# Patient Record
Sex: Female | Born: 1970 | Race: Black or African American | Hispanic: No | Marital: Married | Smoking: Former smoker
Health system: Southern US, Community
[De-identification: ages and names within clinical notes are randomized; demographics above are authoritative.]

## PROBLEM LIST (undated history)

## (undated) DIAGNOSIS — G43909 Migraine, unspecified, not intractable, without status migrainosus: Secondary | ICD-10-CM

## (undated) DIAGNOSIS — F1010 Alcohol abuse, uncomplicated: Secondary | ICD-10-CM

## (undated) DIAGNOSIS — J45909 Unspecified asthma, uncomplicated: Secondary | ICD-10-CM

## (undated) DIAGNOSIS — K219 Gastro-esophageal reflux disease without esophagitis: Secondary | ICD-10-CM

## (undated) DIAGNOSIS — J302 Other seasonal allergic rhinitis: Secondary | ICD-10-CM

## (undated) DIAGNOSIS — C801 Malignant (primary) neoplasm, unspecified: Secondary | ICD-10-CM

## (undated) DIAGNOSIS — E041 Nontoxic single thyroid nodule: Secondary | ICD-10-CM

## (undated) DIAGNOSIS — I499 Cardiac arrhythmia, unspecified: Secondary | ICD-10-CM

## (undated) DIAGNOSIS — F410 Panic disorder [episodic paroxysmal anxiety] without agoraphobia: Secondary | ICD-10-CM

## (undated) DIAGNOSIS — F39 Unspecified mood [affective] disorder: Secondary | ICD-10-CM

## (undated) DIAGNOSIS — G35 Multiple sclerosis: Secondary | ICD-10-CM

## (undated) DIAGNOSIS — R630 Anorexia: Secondary | ICD-10-CM

## (undated) DIAGNOSIS — C8400 Mycosis fungoides, unspecified site: Secondary | ICD-10-CM

## (undated) HISTORY — DX: Migraine, unspecified, not intractable, without status migrainosus: G43.909

## (undated) HISTORY — DX: Alcohol abuse, uncomplicated: F10.10

## (undated) HISTORY — DX: Gastro-esophageal reflux disease without esophagitis: K21.9

## (undated) HISTORY — DX: Panic disorder (episodic paroxysmal anxiety): F41.0

## (undated) HISTORY — DX: Unspecified mood (affective) disorder: F39

## (undated) HISTORY — DX: Nontoxic single thyroid nodule: E04.1

## (undated) HISTORY — PX: OTHER SURGICAL HISTORY: SHX169

## (undated) HISTORY — DX: Anorexia: R63.0

## (undated) HISTORY — PX: JOINT REPLACEMENT: SHX530

## (undated) HISTORY — DX: Other seasonal allergic rhinitis: J30.2

## (undated) HISTORY — DX: Unspecified asthma, uncomplicated: J45.909

---

## 2001-08-16 ENCOUNTER — Other Ambulatory Visit: Admission: RE | Admit: 2001-08-16 | Discharge: 2001-08-16 | Payer: Self-pay | Admitting: Internal Medicine

## 2002-03-22 ENCOUNTER — Encounter: Payer: Self-pay | Admitting: Internal Medicine

## 2002-03-22 ENCOUNTER — Encounter: Admission: RE | Admit: 2002-03-22 | Discharge: 2002-03-22 | Payer: Self-pay | Admitting: Internal Medicine

## 2002-08-02 ENCOUNTER — Other Ambulatory Visit: Admission: RE | Admit: 2002-08-02 | Discharge: 2002-08-02 | Payer: Self-pay | Admitting: Internal Medicine

## 2003-08-05 ENCOUNTER — Encounter: Admission: RE | Admit: 2003-08-05 | Discharge: 2003-08-05 | Payer: Self-pay | Admitting: Internal Medicine

## 2003-09-20 ENCOUNTER — Encounter: Admission: RE | Admit: 2003-09-20 | Discharge: 2003-09-20 | Payer: Self-pay | Admitting: Neurosurgery

## 2004-12-23 ENCOUNTER — Other Ambulatory Visit: Admission: RE | Admit: 2004-12-23 | Discharge: 2004-12-23 | Payer: Self-pay | Admitting: Internal Medicine

## 2006-08-13 ENCOUNTER — Other Ambulatory Visit: Admission: RE | Admit: 2006-08-13 | Discharge: 2006-08-13 | Payer: Self-pay | Admitting: Family Medicine

## 2007-11-28 ENCOUNTER — Other Ambulatory Visit: Admission: RE | Admit: 2007-11-28 | Discharge: 2007-11-28 | Payer: Self-pay | Admitting: Family Medicine

## 2009-01-11 ENCOUNTER — Other Ambulatory Visit: Admission: RE | Admit: 2009-01-11 | Discharge: 2009-01-11 | Payer: Self-pay | Admitting: Family Medicine

## 2009-03-11 ENCOUNTER — Encounter: Admission: RE | Admit: 2009-03-11 | Discharge: 2009-03-11 | Payer: Self-pay | Admitting: Family Medicine

## 2009-03-26 ENCOUNTER — Encounter (INDEPENDENT_AMBULATORY_CARE_PROVIDER_SITE_OTHER): Payer: Self-pay | Admitting: Interventional Radiology

## 2009-03-26 ENCOUNTER — Encounter: Admission: RE | Admit: 2009-03-26 | Discharge: 2009-03-26 | Payer: Self-pay | Admitting: Family Medicine

## 2009-03-26 ENCOUNTER — Other Ambulatory Visit: Admission: RE | Admit: 2009-03-26 | Discharge: 2009-03-26 | Payer: Self-pay | Admitting: Interventional Radiology

## 2010-03-31 ENCOUNTER — Other Ambulatory Visit: Admission: RE | Admit: 2010-03-31 | Discharge: 2010-03-31 | Payer: Self-pay | Admitting: Family Medicine

## 2010-09-07 ENCOUNTER — Encounter: Payer: Self-pay | Admitting: Family Medicine

## 2010-09-08 ENCOUNTER — Encounter: Payer: Self-pay | Admitting: Family Medicine

## 2010-11-11 ENCOUNTER — Other Ambulatory Visit: Payer: Self-pay | Admitting: Family Medicine

## 2010-11-11 DIAGNOSIS — E041 Nontoxic single thyroid nodule: Secondary | ICD-10-CM

## 2011-01-08 ENCOUNTER — Other Ambulatory Visit: Payer: Self-pay | Admitting: Dermatology

## 2011-10-16 ENCOUNTER — Inpatient Hospital Stay (HOSPITAL_COMMUNITY): Admit: 2011-10-16 | Payer: Self-pay

## 2011-10-16 ENCOUNTER — Other Ambulatory Visit (HOSPITAL_COMMUNITY)
Admission: RE | Admit: 2011-10-16 | Discharge: 2011-10-16 | Disposition: A | Discharge status: Home / Self Care | Payer: Managed Care, Other (non HMO) | Source: Ambulatory Visit | Attending: Family Medicine | Admitting: Family Medicine

## 2011-10-16 DIAGNOSIS — Z1159 Encounter for screening for other viral diseases: Secondary | ICD-10-CM | POA: Insufficient documentation

## 2011-10-16 DIAGNOSIS — Z124 Encounter for screening for malignant neoplasm of cervix: Secondary | ICD-10-CM | POA: Insufficient documentation

## 2011-10-19 ENCOUNTER — Other Ambulatory Visit: Payer: Self-pay | Admitting: Family Medicine

## 2011-10-19 DIAGNOSIS — E041 Nontoxic single thyroid nodule: Secondary | ICD-10-CM

## 2011-11-13 ENCOUNTER — Ambulatory Visit
Admission: RE | Admit: 2011-11-13 | Discharge: 2011-11-13 | Disposition: A | Discharge status: Home / Self Care | Payer: Managed Care, Other (non HMO) | Source: Ambulatory Visit | Attending: Family Medicine | Admitting: Family Medicine

## 2011-11-13 DIAGNOSIS — E041 Nontoxic single thyroid nodule: Secondary | ICD-10-CM

## 2011-12-08 ENCOUNTER — Other Ambulatory Visit: Payer: Self-pay

## 2016-01-22 ENCOUNTER — Encounter: Payer: Self-pay | Admitting: *Deleted

## 2016-01-23 ENCOUNTER — Encounter: Payer: Self-pay | Admitting: *Deleted

## 2016-01-23 ENCOUNTER — Ambulatory Visit (INDEPENDENT_AMBULATORY_CARE_PROVIDER_SITE_OTHER): Payer: BLUE CROSS/BLUE SHIELD | Admitting: Neurology

## 2016-01-23 ENCOUNTER — Encounter: Payer: Self-pay | Admitting: Neurology

## 2016-01-23 ENCOUNTER — Other Ambulatory Visit (INDEPENDENT_AMBULATORY_CARE_PROVIDER_SITE_OTHER): Payer: BLUE CROSS/BLUE SHIELD

## 2016-01-23 ENCOUNTER — Telehealth: Payer: Self-pay | Admitting: Neurology

## 2016-01-23 VITALS — BP 110/80 | HR 116 | Ht 66.0 in | Wt 190.2 lb

## 2016-01-23 DIAGNOSIS — R202 Paresthesia of skin: Secondary | ICD-10-CM

## 2016-01-23 DIAGNOSIS — R29898 Other symptoms and signs involving the musculoskeletal system: Secondary | ICD-10-CM

## 2016-01-23 LAB — VITAMIN B12: Vitamin B-12: 144 pg/mL — ABNORMAL LOW (ref 211–911)

## 2016-01-23 LAB — SEDIMENTATION RATE: Sed Rate: 9 mm/hr (ref 0–20)

## 2016-01-23 LAB — VITAMIN D 25 HYDROXY (VIT D DEFICIENCY, FRACTURES): VITD: 16.78 ng/mL — ABNORMAL LOW (ref 30.00–100.00)

## 2016-01-23 MED ORDER — GABAPENTIN 300 MG PO CAPS: ORAL_CAPSULE | ORAL | Status: DC

## 2016-01-23 NOTE — Telephone Encounter (Signed)
Gloria Reeves 08-Nov-1970. She was needing a letter from Dr. Posey Pronto saying that she can be out of work from Monday to Wednesday. Please call her at [phone]. Thank you

## 2016-01-23 NOTE — Telephone Encounter (Signed)
OK to give letter 

## 2016-01-23 NOTE — Progress Notes (Signed)
Note routed

## 2016-01-23 NOTE — Telephone Encounter (Signed)
Please advise 

## 2016-01-23 NOTE — Patient Instructions (Addendum)
MRI brain with and without contrast Start gabapentin 1 tablet at bedtime for 3 days, then increase to 1 tablet twice daily. Check blood work Start physical therapy

## 2016-01-23 NOTE — Progress Notes (Signed)
[facility] Clinic Note - Initial Visit   Date: 01/23/2016  Gloria Reeves MRN: [id number] DOB: 05/22/1971   Dear Dr. Webb:  Thank you for your kind referral of Gloria Reeves for consultation of left arm pain and paresthesias. Although her history is well known to you, please allow us to reiterate it for the purpose of our medical record. The patient was accompanied to the clinic by self.   History of Present Illness: Gloria Reeves is a 45 y.o. left-handed African American female with asthma and panic attacks presenting for evaluation of left arm tingling and weakness.    Early June 2017, she developed new onset severe biparietal throbbing headache which lasted all day.  She took ibuprofen which did not help, but the headache has resolved by the following morning.  However, the following day, she developed tingling over the left side of the neck, ear, and entire left arm, forearm, and hand.  Symptoms are constant and she has not identified triggers or alleviating factors.  She has throbbing numbness which is exacerbated by movement.  She endorses weakness of the entire left arm and is unable to write, dress herself, or take a shower.  She was evaluated in urgent care who gave her a course of prednisone and flexeril, but denies any improvement.  She was seen by her PCP 3-days ago for these symptoms who referred her for urgent evaluation.   In 2004, she had tingling of the hands and underwent MRI cervical spine and EMG which was essentially normal.    Out-side paper records, electronic medical record, and images have been reviewed where available and summarized as:  MRI cervical spine wo contrast 08/05/2003:  There is mild cervical kyphosis. There is a small central disk herniation at C4-5.    Past Medical History  Diagnosis Date  . Migraine   . Esophageal reflux   . Seasonal allergies   . Asthma   . Anorexia   . Mood disorder (HCC)   .  Panic disorder   . Alcohol abuse   . Thyroid nodule     No past surgical history on file.   Medications:  Outpatient Encounter Prescriptions as of 01/23/2016  Medication Sig  . acetaminophen (TYLENOL) 325 MG tablet Take 650 mg by mouth every 6 (six) hours as needed.  . cyclobenzaprine (FLEXERIL) 10 MG tablet Take 10 mg by mouth at bedtime.  . ibuprofen (ADVIL,MOTRIN) 200 MG tablet Take 200 mg by mouth every 6 (six) hours as needed.  . medroxyPROGESTERone (DEPO-PROVERA) 150 MG/ML injection Inject 150 mg into the muscle every 3 (three) months.  . Multiple Vitamin (MULTIVITAMIN) tablet Take 1 tablet by mouth daily.  . naproxen sodium (ANAPROX) 220 MG tablet Take 220 mg by mouth 2 (two) times daily with a meal.  . [DISCONTINUED] buPROPion (WELLBUTRIN SR) 150 MG 12 hr tablet Take 150 mg by mouth daily.  . [DISCONTINUED] meloxicam (MOBIC) 7.5 MG tablet Take 7.5 mg by mouth daily.   No facility-administered encounter medications on file as of 01/23/2016.     Allergies:  Allergies  Allergen Reactions  . Keppra [Levetiracetam]   . Sulfa Antibiotics     Family History: No family history on file.  Social History: Social History  Substance Use Topics  . Smoking status: Former Smoker    Types: Cigarettes    Quit date: 09/05/2010  . Smokeless tobacco: Never Used  . Alcohol Use: 0.0 oz/week    0 Standard drinks or equivalent per   week   Social History   Social History Narrative   Lives alone in a one story home.  No children.  Works as a new business associate.  Education: college.    Review of Systems:  CONSTITUTIONAL: No fevers, chills, night sweats, or weight loss.   EYES: No visual changes or eye pain ENT: No hearing changes.  No history of nose bleeds.   RESPIRATORY: No cough, wheezing and shortness of breath.   CARDIOVASCULAR: Negative for chest pain, and palpitations.   GI: Negative for abdominal discomfort, blood in stools or black stools.  No recent change in bowel  habits.   GU:  No history of incontinence.   MUSCLOSKELETAL: No history of joint pain or swelling.  No myalgias.   SKIN: Negative for lesions, rash, and itching.   HEMATOLOGY/ONCOLOGY: Negative for prolonged bleeding, bruising easily, and swollen nodes.  No history of cancer.   ENDOCRINE: Negative for cold or heat intolerance, polydipsia or goiter.   PSYCH:  No depression +anxiety symptoms.   NEURO: As Above.   Vital Signs:  BP 110/80 mmHg  Pulse 116  Ht 5' 6" (1.676 m)  Wt 190 lb 3 oz (86.268 kg)  BMI 30.71 kg/m2  SpO2 98%   General Medical Exam:   General:  Well appearing, protecting the left arm even while sitting, comfortable.   Eyes/ENT: see cranial nerve examination.   Neck: No masses appreciated.  Full range of motion without tenderness.  No carotid bruits. Respiratory:  Clear to auscultation, good air entry bilaterally.   Cardiac:  Regular rate and rhythm, no murmur.   Extremities:  No deformities, edema, or skin discoloration.  Skin:  No rashes or lesions.  Neurological Exam: MENTAL STATUS including orientation to time, place, person, recent and remote memory, attention span and concentration, language, and fund of knowledge is normal.  Speech is not dysarthric.  CRANIAL NERVES: II:  No visual field defects.  Unremarkable fundi.   III-IV-VI: Pupils equal round and reactive to light.  Normal conjugate, extra-ocular eye movements in all directions of gaze.  No nystagmus.  No ptosis.   V:  Normal facial sensation.     VII:  Normal facial symmetry and movements.   VIII:  Normal hearing and vestibular function.   IX-X:  Normal palatal movement.   XI:  Normal shoulder shrug and head rotation.   XII:  Normal tongue strength and range of motion, no deviation or fasciculation.  MOTOR:  Motor strength testing of all the extremities is 5/5 except, there is marked give-way weakness on the left upper extremities and bilateral intrinsic hand muscles.  No atrophy, fasciculations or  abnormal movements.  No pronator drift.  Tone is normal.    MSRs:  Right                                                                 Left brachioradialis 2+  brachioradialis 2+  biceps 2+  biceps 2+  triceps 2+  triceps 2+  patellar 2+  patellar 2+  ankle jerk 2+  ankle jerk 2+  Hoffman no  Hoffman no  plantar response down  plantar response down   SENSORY:  Normal and symmetric perception of light touch, pinprick, vibration, and proprioception.   COORDINATION/GAIT: Normal finger-to- nose-finger   and heel-to-shin.  Intact rapid alternating movements bilaterally.  Able to rise from a chair without using arms.  Gait narrow based and stable. Tandem and stressed gait intact.    IMPRESSION: Ms. Hardin Negus is a 45 year-old female referred for evaluation of left arm paresthesias and weakness which started abruptly in early June, following onset of a headache.  Her exam is difficult to accurately assess given marked give-way weakness.  She does not give full effort on motor testing, but proximal muscles on the left with repeated attempts do appears to be 5/5.  It would be highly unusual to have entire arm paresthesias and weakness with normal reflexes, if this was stemming from the cervical spinal cord.  Therefore, I will obtain imaging of the brain to look for structural changes such as demyelinating disease.  I agree with her PCP that this is not consistent with shingles.    PLAN/RECOMMENDATIONS:  1.  MRI brain wwo contrast 2.  Check ESR, vitamin B12, vitamin D 3.  Start gabapentin 1 tablet at bedtime for 3 days, then increase to 1 tablet twice daily. 4.  Start physical therapy  Return to clinic in 2-3 months.   The duration of this appointment visit was 40 minutes of face-to-face time with the patient.  Greater than 50% of this time was spent in counseling, explanation of diagnosis, planning of further management, and coordination of care.   Thank you for allowing me to participate in  patient's care.  If I can answer any additional questions, I would be pleased to do so.    Sincerely,    Donika K. Posey Pronto, DO

## 2016-01-24 ENCOUNTER — Other Ambulatory Visit: Payer: Self-pay | Admitting: *Deleted

## 2016-01-24 DIAGNOSIS — E559 Vitamin D deficiency, unspecified: Secondary | ICD-10-CM

## 2016-01-24 MED ORDER — VITAMIN D (ERGOCALCIFEROL) 1.25 MG (50000 UNIT) PO CAPS: 50000.0000 [IU] | ORAL_CAPSULE | ORAL | Status: AC

## 2016-01-24 NOTE — Telephone Encounter (Signed)
Patient said that this has already been taken care of.

## 2016-01-29 ENCOUNTER — Telehealth: Payer: Self-pay | Admitting: Neurology

## 2016-01-29 ENCOUNTER — Ambulatory Visit
Admission: RE | Admit: 2016-01-29 | Discharge: 2016-01-29 | Disposition: A | Discharge status: Home / Self Care | Payer: BLUE CROSS/BLUE SHIELD | Source: Ambulatory Visit | Attending: Neurology | Admitting: Neurology

## 2016-01-29 DIAGNOSIS — R29898 Other symptoms and signs involving the musculoskeletal system: Secondary | ICD-10-CM

## 2016-01-29 DIAGNOSIS — R202 Paresthesia of skin: Secondary | ICD-10-CM

## 2016-01-29 MED ORDER — GADOBENATE DIMEGLUMINE 529 MG/ML IV SOLN
18.0000 mL | Freq: Once | INTRAVENOUS | Status: AC | PRN
  Administered 2016-01-29: 18 mL via INTRAVENOUS

## 2016-01-29 NOTE — Telephone Encounter (Signed)
[facility] called with STAT report on patient's MR Brain available in EPIC.

## 2016-01-29 NOTE — Telephone Encounter (Signed)
Staff got call on pts MRI.  Dr Posey Pronto out of office but will be back tomorrow.  Lets get pt set up for MRI cervical spine with and without so that we can view this lesion better and see if there are more.  Dx:  Demyelinating disease.  Caryl Pina, just tell patient that we need to look down further in the spinal cord as it was incompletely imaged since only brain done thus far.

## 2016-01-30 ENCOUNTER — Other Ambulatory Visit: Payer: Self-pay | Admitting: *Deleted

## 2016-01-30 DIAGNOSIS — G379 Demyelinating disease of central nervous system, unspecified: Secondary | ICD-10-CM

## 2016-01-30 NOTE — Telephone Encounter (Signed)
Patient notified and order placed for MRI cervical spine.

## 2016-02-07 ENCOUNTER — Telehealth: Payer: Self-pay | Admitting: Neurology

## 2016-02-07 ENCOUNTER — Ambulatory Visit
Admission: RE | Admit: 2016-02-07 | Discharge: 2016-02-07 | Disposition: A | Discharge status: Home / Self Care | Payer: BLUE CROSS/BLUE SHIELD | Source: Ambulatory Visit | Attending: Neurology | Admitting: Neurology

## 2016-02-07 DIAGNOSIS — G379 Demyelinating disease of central nervous system, unspecified: Secondary | ICD-10-CM

## 2016-02-07 NOTE — Telephone Encounter (Signed)
Called and discussed results of MRI cervical spine which is concerning for a high cervical demyelinating lesion.  CSF testing is the next step to look for MS as well as exclude other possibilities like transverse myelitis.   CSF testing to include:  cell count and diff, protein, glucose, routine cultures, IgG index, oligoclonal bands, myelin basic protein, flow cytology, ACE, CSF lyme PCR, CSF VZV PCR, VDRL   Also instructed her that she needs to start vitamin B12 injections, which is she is agreeable to.  Aria Pickrell K. Posey Pronto, DO

## 2016-02-10 ENCOUNTER — Other Ambulatory Visit: Payer: Self-pay | Admitting: *Deleted

## 2016-02-10 DIAGNOSIS — R202 Paresthesia of skin: Secondary | ICD-10-CM

## 2016-02-10 DIAGNOSIS — G379 Demyelinating disease of central nervous system, unspecified: Secondary | ICD-10-CM

## 2016-02-10 DIAGNOSIS — R29898 Other symptoms and signs involving the musculoskeletal system: Secondary | ICD-10-CM

## 2016-02-10 NOTE — Telephone Encounter (Signed)
Order placed and labs faxed to [facility] at [employer].

## 2016-02-12 ENCOUNTER — Ambulatory Visit (INDEPENDENT_AMBULATORY_CARE_PROVIDER_SITE_OTHER): Payer: BLUE CROSS/BLUE SHIELD | Admitting: *Deleted

## 2016-02-12 DIAGNOSIS — E538 Deficiency of other specified B group vitamins: Secondary | ICD-10-CM | POA: Diagnosis not present

## 2016-02-12 MED ORDER — CYANOCOBALAMIN 1000 MCG/ML IJ SOLN
1000.0000 ug | Freq: Once | INTRAMUSCULAR | Status: AC
  Administered 2016-02-12: 1000 ug via INTRAMUSCULAR

## 2016-02-12 NOTE — Progress Notes (Signed)
Patient in for B12 injection. 

## 2016-02-13 ENCOUNTER — Ambulatory Visit (INDEPENDENT_AMBULATORY_CARE_PROVIDER_SITE_OTHER): Payer: BLUE CROSS/BLUE SHIELD | Admitting: *Deleted

## 2016-02-13 DIAGNOSIS — E538 Deficiency of other specified B group vitamins: Secondary | ICD-10-CM | POA: Diagnosis not present

## 2016-02-13 MED ORDER — CYANOCOBALAMIN 1000 MCG/ML IJ SOLN
1000.0000 ug | Freq: Once | INTRAMUSCULAR | Status: AC
  Administered 2016-02-13: 1000 ug via INTRAMUSCULAR

## 2016-02-13 NOTE — Progress Notes (Signed)
Patient in for B12 injection. 

## 2016-02-14 ENCOUNTER — Ambulatory Visit (INDEPENDENT_AMBULATORY_CARE_PROVIDER_SITE_OTHER): Payer: BLUE CROSS/BLUE SHIELD | Admitting: *Deleted

## 2016-02-14 DIAGNOSIS — E538 Deficiency of other specified B group vitamins: Secondary | ICD-10-CM

## 2016-02-14 MED ORDER — CYANOCOBALAMIN 1000 MCG/ML IJ SOLN
1000.0000 ug | Freq: Once | INTRAMUSCULAR | Status: AC
  Administered 2016-02-14: 1000 ug via INTRAMUSCULAR

## 2016-02-14 NOTE — Progress Notes (Signed)
Patient in for B12 injection. 

## 2016-02-17 ENCOUNTER — Ambulatory Visit (INDEPENDENT_AMBULATORY_CARE_PROVIDER_SITE_OTHER): Payer: BLUE CROSS/BLUE SHIELD | Admitting: *Deleted

## 2016-02-17 DIAGNOSIS — E538 Deficiency of other specified B group vitamins: Secondary | ICD-10-CM | POA: Diagnosis not present

## 2016-02-17 MED ORDER — CYANOCOBALAMIN 1000 MCG/ML IJ SOLN
1000.0000 ug | Freq: Once | INTRAMUSCULAR | Status: AC
  Administered 2016-02-17: 1000 ug via INTRAMUSCULAR

## 2016-02-17 NOTE — Progress Notes (Signed)
Patient in for B12 injection. 

## 2016-02-19 ENCOUNTER — Ambulatory Visit (INDEPENDENT_AMBULATORY_CARE_PROVIDER_SITE_OTHER): Payer: BLUE CROSS/BLUE SHIELD | Admitting: Neurology

## 2016-02-19 DIAGNOSIS — E538 Deficiency of other specified B group vitamins: Secondary | ICD-10-CM

## 2016-02-19 MED ORDER — CYANOCOBALAMIN 1000 MCG/ML IJ SOLN
1000.0000 ug | Freq: Once | INTRAMUSCULAR | Status: AC
  Administered 2016-02-19: 1000 ug via INTRAMUSCULAR

## 2016-02-19 NOTE — Progress Notes (Signed)
B12 injection to right deltoid with no apparent complications.   

## 2016-02-20 ENCOUNTER — Ambulatory Visit (INDEPENDENT_AMBULATORY_CARE_PROVIDER_SITE_OTHER): Payer: BLUE CROSS/BLUE SHIELD | Admitting: *Deleted

## 2016-02-20 DIAGNOSIS — E538 Deficiency of other specified B group vitamins: Secondary | ICD-10-CM

## 2016-02-20 MED ORDER — CYANOCOBALAMIN 1000 MCG/ML IJ SOLN
1000.0000 ug | Freq: Once | INTRAMUSCULAR | Status: AC
  Administered 2016-02-20: 1000 ug via INTRAMUSCULAR

## 2016-02-20 NOTE — Progress Notes (Signed)
Patient in for B12 injection. 

## 2016-02-21 ENCOUNTER — Other Ambulatory Visit: Payer: Self-pay | Admitting: Neurology

## 2016-02-21 ENCOUNTER — Other Ambulatory Visit (HOSPITAL_COMMUNITY)
Admission: RE | Admit: 2016-02-21 | Discharge: 2016-02-21 | Disposition: A | Discharge status: Home / Self Care | Payer: BLUE CROSS/BLUE SHIELD | Source: Ambulatory Visit | Attending: Neurology | Admitting: Neurology

## 2016-02-21 ENCOUNTER — Ambulatory Visit
Admission: RE | Admit: 2016-02-21 | Discharge: 2016-02-21 | Disposition: A | Discharge status: Home / Self Care | Payer: BLUE CROSS/BLUE SHIELD | Source: Ambulatory Visit | Attending: Neurology | Admitting: Neurology

## 2016-02-21 DIAGNOSIS — G379 Demyelinating disease of central nervous system, unspecified: Secondary | ICD-10-CM | POA: Diagnosis not present

## 2016-02-21 DIAGNOSIS — R202 Paresthesia of skin: Secondary | ICD-10-CM | POA: Diagnosis not present

## 2016-02-21 DIAGNOSIS — R29898 Other symptoms and signs involving the musculoskeletal system: Secondary | ICD-10-CM | POA: Diagnosis not present

## 2016-02-21 LAB — PROTEIN, CSF: Total Protein, CSF: 34 mg/dL (ref 15–45)

## 2016-02-21 LAB — CSF CELL COUNT WITH DIFFERENTIAL
Basophils, %: 0 %
Eosinophils, CSF: 0 %
LYMPHS CSF: 94 % — AB (ref 40–80)
MONOCYTE/MACROPHAGE: 6 % — AB (ref 15–45)
RBC COUNT CSF: 0 {cells}/uL (ref 0–10)
Segmented Neutrophils-CSF: 0 % (ref 0–6)
WBC CSF: 4 {cells}/uL (ref 0–5)

## 2016-02-21 LAB — GLUCOSE, CSF: Glucose, CSF: 61 mg/dL (ref 43–76)

## 2016-02-21 NOTE — Discharge Instructions (Signed)

## 2016-02-21 NOTE — Progress Notes (Signed)
One SST tube of blood drawn for LP labs from left [facility] space; site unremarkable; by Cristal Ford, RN.

## 2016-02-24 ENCOUNTER — Other Ambulatory Visit: Payer: BLUE CROSS/BLUE SHIELD

## 2016-02-24 ENCOUNTER — Ambulatory Visit (INDEPENDENT_AMBULATORY_CARE_PROVIDER_SITE_OTHER): Payer: BLUE CROSS/BLUE SHIELD | Admitting: *Deleted

## 2016-02-24 ENCOUNTER — Other Ambulatory Visit: Payer: Self-pay | Admitting: *Deleted

## 2016-02-24 DIAGNOSIS — E538 Deficiency of other specified B group vitamins: Secondary | ICD-10-CM | POA: Diagnosis not present

## 2016-02-24 LAB — CSF CULTURE
GRAM STAIN: NONE SEEN
ORGANISM ID, BACTERIA: NO GROWTH

## 2016-02-24 LAB — CSF CULTURE W GRAM STAIN

## 2016-02-24 MED ORDER — BUTALBITAL-APAP-CAFFEINE 50-325-40 MG PO TABS: 1.0000 | ORAL_TABLET | Freq: Two times a day (BID) | ORAL | Status: DC | PRN

## 2016-02-24 MED ORDER — CYANOCOBALAMIN 1000 MCG/ML IJ SOLN
1000.0000 ug | Freq: Once | INTRAMUSCULAR | Status: AC
  Administered 2016-02-24: 1000 ug via INTRAMUSCULAR

## 2016-02-24 NOTE — Progress Notes (Signed)
Patient in for B12 injection. 

## 2016-02-25 ENCOUNTER — Other Ambulatory Visit: Payer: Self-pay | Admitting: Neurology

## 2016-02-26 LAB — OLIGOCLONAL BANDS, CSF + SERM

## 2016-02-27 ENCOUNTER — Telehealth: Payer: Self-pay | Admitting: Neurology

## 2016-02-27 ENCOUNTER — Other Ambulatory Visit: Payer: Self-pay | Admitting: *Deleted

## 2016-02-27 ENCOUNTER — Ambulatory Visit: Payer: BLUE CROSS/BLUE SHIELD | Admitting: Neurology

## 2016-02-27 DIAGNOSIS — R202 Paresthesia of skin: Secondary | ICD-10-CM

## 2016-02-27 DIAGNOSIS — R29898 Other symptoms and signs involving the musculoskeletal system: Secondary | ICD-10-CM

## 2016-02-27 LAB — BORRELIA SPECIES DNA, FLUID, PCR: B. burgdorferi DNA: NOT DETECTED

## 2016-02-27 LAB — VARICELLA-ZOSTER BY PCR: VZV DNA, QL PCR: NOT DETECTED

## 2016-02-27 MED ORDER — METHYLPREDNISOLONE SODIUM SUCC 1000 MG IJ SOLR: 1000.0000 mg | Freq: Every day | INTRAMUSCULAR | Status: AC

## 2016-02-27 NOTE — Telephone Encounter (Signed)
Called patient to discuss her CSF results which favors an inflammatory process (positive oligoclonal bands, IgG index pending), likely multiple sclerosis.  I will treat her with Solumedrol 1g x 3 days.  Risks and benefits discussed.    Headache following LP has significantly improved with Fioricet.    Gloria K. Posey Pronto, DO

## 2016-02-27 NOTE — Telephone Encounter (Signed)
Left message for someone to call me back from short stay.

## 2016-02-28 NOTE — Telephone Encounter (Signed)
Patient notified that the infusions will start on July 24 at 12:00.

## 2016-02-29 LAB — VDRL, CSF: SYPHILIS VDRL QUANT CSF: NONREACTIVE

## 2016-02-29 LAB — ANGIOTENSIN CONVERTING ENZYME, CSF: ACE, CSF: 8 U/L (ref <=15)

## 2016-03-02 ENCOUNTER — Other Ambulatory Visit: Payer: Self-pay | Admitting: Family Medicine

## 2016-03-02 ENCOUNTER — Ambulatory Visit (INDEPENDENT_AMBULATORY_CARE_PROVIDER_SITE_OTHER): Payer: BLUE CROSS/BLUE SHIELD | Admitting: *Deleted

## 2016-03-02 DIAGNOSIS — G44009 Cluster headache syndrome, unspecified, not intractable: Secondary | ICD-10-CM

## 2016-03-02 DIAGNOSIS — E538 Deficiency of other specified B group vitamins: Secondary | ICD-10-CM

## 2016-03-02 LAB — CNS IGG SYNTHESIS RATE, CSF+BLOOD
ALBUMIN CSF: 17.4 mg/dL (ref 8.0–42.0)
Albumin, Serum(Neph): 4.4 g/dL (ref 3.5–4.9)
IGG, CSF: 5.9 mg/dL (ref 0.8–7.7)
IGG, SERUM: 1290 mg/dL (ref 694–1618)
IgG Index, CSF: 1.16 — ABNORMAL HIGH (ref <0.66)
MS CNS IGG SYNTHESIS RATE: 13.1 mg/(24.h) — AB (ref <=3.3)

## 2016-03-02 LAB — MYELIN BASIC PROTEIN, CSF: Myelin Basic Protein: <2 mcg/L (ref 0.0–4.0)

## 2016-03-02 MED ORDER — CYANOCOBALAMIN 1000 MCG/ML IJ SOLN
1000.0000 ug | Freq: Once | INTRAMUSCULAR | Status: AC
  Administered 2016-03-02: 1000 ug via INTRAMUSCULAR

## 2016-03-02 NOTE — Progress Notes (Signed)
Patient in for B12 injection. 

## 2016-03-04 ENCOUNTER — Other Ambulatory Visit: Payer: BLUE CROSS/BLUE SHIELD

## 2016-03-06 ENCOUNTER — Other Ambulatory Visit: Payer: Self-pay | Admitting: Family Medicine

## 2016-03-06 ENCOUNTER — Other Ambulatory Visit (HOSPITAL_COMMUNITY): Payer: Self-pay | Admitting: *Deleted

## 2016-03-06 DIAGNOSIS — E041 Nontoxic single thyroid nodule: Secondary | ICD-10-CM

## 2016-03-09 ENCOUNTER — Encounter (HOSPITAL_COMMUNITY)
Admission: RE | Admit: 2016-03-09 | Discharge: 2016-03-09 | Disposition: A | Discharge status: Home / Self Care | Payer: BLUE CROSS/BLUE SHIELD | Source: Ambulatory Visit | Attending: Neurology | Admitting: Neurology

## 2016-03-09 ENCOUNTER — Ambulatory Visit: Payer: BLUE CROSS/BLUE SHIELD

## 2016-03-09 DIAGNOSIS — G35 Multiple sclerosis: Secondary | ICD-10-CM | POA: Diagnosis not present

## 2016-03-09 HISTORY — DX: Multiple sclerosis: G35

## 2016-03-09 MED ORDER — METHYLPREDNISOLONE SODIUM SUCC 1000 MG IJ SOLR
1000.0000 mg | Freq: Every day | INTRAMUSCULAR | Status: DC
  Administered 2016-03-09: 1000 mg via INTRAVENOUS
  Filled 2016-03-09: qty 8

## 2016-03-09 NOTE — Discharge Instructions (Signed)
Methylprednisolone Solution for Injection  What is this medicine?  METHYLPREDNISOLONE (meth ill pred NISS oh lone) is a corticosteroid. It is commonly used to treat inflammation of the skin, joints, lungs, and other organs. Common conditions treated include asthma, allergies, and arthritis. It is also used for other conditions, such as blood disorders and diseases of the adrenal glands.  This medicine may be used for other purposes; ask your health care provider or pharmacist if you have questions.  What should I tell my health care provider before I take this medicine?  They need to know if you have any of these conditions:  -cataracts or glaucoma  -Cushing's syndrome  -heart disease  -high blood pressure  -infection including tuberculosis  -low calcium or potassium levels in the blood  -recent surgery  -seizures  -stomach or intestinal disease, including colitis  -threadworms  -thyroid problems  -an unusual or allergic reaction to methylprednisolone, corticosteroids, benzyl alcohol, other medicines, foods, dyes, or preservatives  -pregnant or trying to get pregnant  -breast-feeding  How should I use this medicine?  This medicine is for injection or infusion into a vein. It is also for injection into a muscle. It is given by a health care professional in a hospital or clinic setting.  Talk to your pediatrician regarding the use of this medicine in children. While this drug may be prescribed for selected conditions, precautions do apply.  Overdosage: If you think you have taken too much of this medicine contact a poison control center or emergency room at once.  NOTE: This medicine is only for you. Do not share this medicine with others.  What if I miss a dose?  This does not apply.  What may interact with this medicine?  Do not take this medicine with any of the following medications:  -mifepristone  This medicine may also interact with the following medications:  -aspirin and aspirin-like  medicines  -cyclosporin  -ketoconazole  -phenobarbital  -phenytoin  -rifampin  -tacrolimus  -troleandomycin  -vaccines  -warfarin  This list may not describe all possible interactions. Give your health care provider a list of all the medicines, herbs, non-prescription drugs, or dietary supplements you use. Also tell them if you smoke, drink alcohol, or use illegal drugs. Some items may interact with your medicine.  What should I watch for while using this medicine?  Visit your doctor or health care professional for regular checks on your progress. If you are taking this medicine for a long time, carry an identification card with your name and address, the type and dose of your medicine, and your doctor's name and address.  The medicine may increase your risk of getting an infection. Stay away from people who are sick. Tell your doctor or health care professional if you are around anyone with measles or chickenpox.  You may need to avoid some vaccines. Talk to your health care provider for more information.  If you are going to have surgery, tell your doctor or health care professional that you have taken this medicine within the last twelve months.  Ask your doctor or health care professional about your diet. You may need to lower the amount of salt you eat.  The medicine can increase your blood sugar. If you are a diabetic check with your doctor if you need help adjusting the dose of your diabetic medicine.  What side effects may I notice from receiving this medicine?  Side effects that you should report to your doctor or health care   professional as soon as possible:  -allergic reactions like skin rash, itching or hives, swelling of the face, lips, or tongue  -bloody or tarry stools  -changes in vision  -eye pain or bulging eyes  -fever, sore throat, sneezing, cough, or other signs of infection, wounds that will not heal  -increased thirst  -irregular heartbeat  -muscle cramps  -pain in hips, back, ribs, arms,  shoulders, or legs  -swelling of the ankles, feet, hands  -trouble passing urine or change in the amount of urine  -unusual bleeding or bruising  -unusually weak or tired  -weight gain or weight loss  Side effects that usually do not require medical attention (report to your doctor or health care professional if they continue or are bothersome):  -changes in emotions or moods  -constipation or diarrhea  -headache  -irritation at site where injected  -nausea, vomiting  -skin problems, acne, thin and shiny skin  -trouble sleeping  -unusual hair growth on the face or body  This list may not describe all possible side effects. Call your doctor for medical advice about side effects. You may report side effects to FDA at 1-800-FDA-1088.  Where should I keep my medicine?  This drug is given in a hospital or clinic and will not be stored at home.  NOTE: This sheet is a summary. It may not cover all possible information. If you have questions about this medicine, talk to your doctor, pharmacist, or health care provider.      2016, Elsevier/[employer] Standard. (2012-05-03 11:37:16)

## 2016-03-10 ENCOUNTER — Encounter (HOSPITAL_COMMUNITY)
Admission: RE | Admit: 2016-03-10 | Discharge: 2016-03-10 | Disposition: A | Discharge status: Home / Self Care | Payer: BLUE CROSS/BLUE SHIELD | Source: Ambulatory Visit | Attending: Neurology | Admitting: Neurology

## 2016-03-10 DIAGNOSIS — G35 Multiple sclerosis: Secondary | ICD-10-CM | POA: Diagnosis not present

## 2016-03-10 MED ORDER — SODIUM CHLORIDE 0.9 % IV SOLN
1000.0000 mg | Freq: Every day | INTRAVENOUS | Status: DC
  Administered 2016-03-10: 1000 mg via INTRAVENOUS
  Filled 2016-03-10: qty 8

## 2016-03-11 ENCOUNTER — Other Ambulatory Visit: Payer: Self-pay | Admitting: Family Medicine

## 2016-03-11 ENCOUNTER — Encounter (HOSPITAL_COMMUNITY)
Admission: RE | Admit: 2016-03-11 | Discharge: 2016-03-11 | Disposition: A | Discharge status: Home / Self Care | Payer: BLUE CROSS/BLUE SHIELD | Source: Ambulatory Visit | Attending: Neurology | Admitting: Neurology

## 2016-03-11 DIAGNOSIS — G35 Multiple sclerosis: Secondary | ICD-10-CM | POA: Diagnosis not present

## 2016-03-11 DIAGNOSIS — R928 Other abnormal and inconclusive findings on diagnostic imaging of breast: Secondary | ICD-10-CM

## 2016-03-11 DIAGNOSIS — N63 Unspecified lump in unspecified breast: Secondary | ICD-10-CM

## 2016-03-11 MED ORDER — SODIUM CHLORIDE 0.9 % IV SOLN
1000.0000 mg | Freq: Every day | INTRAVENOUS | Status: DC
  Administered 2016-03-11: 1000 mg via INTRAVENOUS
  Filled 2016-03-11: qty 8

## 2016-03-12 ENCOUNTER — Ambulatory Visit (INDEPENDENT_AMBULATORY_CARE_PROVIDER_SITE_OTHER): Payer: BLUE CROSS/BLUE SHIELD

## 2016-03-12 DIAGNOSIS — E538 Deficiency of other specified B group vitamins: Secondary | ICD-10-CM | POA: Diagnosis not present

## 2016-03-12 MED ORDER — CYANOCOBALAMIN 1000 MCG/ML IJ SOLN
1000.0000 ug | Freq: Once | INTRAMUSCULAR | Status: AC
  Administered 2016-03-12: 1000 ug via INTRAMUSCULAR

## 2016-03-16 ENCOUNTER — Ambulatory Visit: Payer: BLUE CROSS/BLUE SHIELD

## 2016-03-19 ENCOUNTER — Encounter (HOSPITAL_COMMUNITY): Payer: Self-pay

## 2016-03-19 DIAGNOSIS — G35 Multiple sclerosis: Secondary | ICD-10-CM

## 2016-03-19 DIAGNOSIS — G35A Relapsing-remitting multiple sclerosis: Secondary | ICD-10-CM | POA: Insufficient documentation

## 2016-03-19 HISTORY — DX: Multiple sclerosis: G35

## 2016-03-19 NOTE — Progress Notes (Signed)
Relapsing remitting multiple sclerosis is the diagnosis.   Thanks,  [employer]

## 2016-03-20 ENCOUNTER — Ambulatory Visit (INDEPENDENT_AMBULATORY_CARE_PROVIDER_SITE_OTHER): Payer: BLUE CROSS/BLUE SHIELD | Admitting: *Deleted

## 2016-03-20 DIAGNOSIS — E538 Deficiency of other specified B group vitamins: Secondary | ICD-10-CM | POA: Diagnosis not present

## 2016-03-20 MED ORDER — CYANOCOBALAMIN 1000 MCG/ML IJ SOLN
1000.0000 ug | Freq: Once | INTRAMUSCULAR | Status: AC
  Administered 2016-03-20: 1000 ug via INTRAMUSCULAR

## 2016-03-25 ENCOUNTER — Ambulatory Visit (INDEPENDENT_AMBULATORY_CARE_PROVIDER_SITE_OTHER): Payer: BLUE CROSS/BLUE SHIELD | Admitting: Neurology

## 2016-03-25 DIAGNOSIS — E538 Deficiency of other specified B group vitamins: Secondary | ICD-10-CM | POA: Diagnosis not present

## 2016-03-25 MED ORDER — CYANOCOBALAMIN 1000 MCG/ML IJ SOLN
1000.0000 ug | Freq: Once | INTRAMUSCULAR | Status: AC
  Administered 2016-03-25: 1000 ug via INTRAMUSCULAR

## 2016-03-25 NOTE — Progress Notes (Signed)
B12 injection to left deltoid with no apparent complications.   

## 2016-04-17 ENCOUNTER — Ambulatory Visit (INDEPENDENT_AMBULATORY_CARE_PROVIDER_SITE_OTHER): Payer: BLUE CROSS/BLUE SHIELD | Admitting: *Deleted

## 2016-04-17 DIAGNOSIS — E538 Deficiency of other specified B group vitamins: Secondary | ICD-10-CM

## 2016-04-17 MED ORDER — CYANOCOBALAMIN 1000 MCG/ML IJ SOLN
1000.0000 ug | Freq: Once | INTRAMUSCULAR | Status: AC
  Administered 2016-04-17: 1000 ug via INTRAMUSCULAR

## 2016-05-22 ENCOUNTER — Ambulatory Visit: Payer: BLUE CROSS/BLUE SHIELD

## 2016-05-25 ENCOUNTER — Ambulatory Visit: Payer: BLUE CROSS/BLUE SHIELD

## 2016-06-23 ENCOUNTER — Other Ambulatory Visit: Payer: Self-pay | Admitting: *Deleted

## 2016-06-23 ENCOUNTER — Telehealth: Payer: Self-pay | Admitting: Neurology

## 2016-06-23 NOTE — Telephone Encounter (Signed)
She needs to set up a follow-up visit with me since I have not seen her since she received steroid infusions. I would recommend she continue vitamin D 4000 units daily  Gloria Devine K. Posey Pronto, DO

## 2016-06-23 NOTE — Telephone Encounter (Signed)
Patient wants to talk to someone about vitamin D please call [phone]

## 2016-06-23 NOTE — Telephone Encounter (Signed)
Patient has finished her vitamin D.  Do you want her to continue or come in to have her levels checked?  Otherwise, she is doing really well.  Please advise.

## 2016-06-23 NOTE — Telephone Encounter (Signed)
Patient given instructions and will call back to set up a follow up appointment.

## 2016-06-25 ENCOUNTER — Ambulatory Visit (INDEPENDENT_AMBULATORY_CARE_PROVIDER_SITE_OTHER): Payer: BLUE CROSS/BLUE SHIELD | Admitting: *Deleted

## 2016-06-25 DIAGNOSIS — E538 Deficiency of other specified B group vitamins: Secondary | ICD-10-CM | POA: Diagnosis not present

## 2016-06-25 MED ORDER — CYANOCOBALAMIN 1000 MCG/ML IJ SOLN
1000.0000 ug | Freq: Once | INTRAMUSCULAR | Status: AC
  Administered 2016-06-25: 1000 ug via INTRAMUSCULAR

## 2016-08-04 ENCOUNTER — Ambulatory Visit (INDEPENDENT_AMBULATORY_CARE_PROVIDER_SITE_OTHER): Payer: BLUE CROSS/BLUE SHIELD | Admitting: Neurology

## 2016-08-04 DIAGNOSIS — E538 Deficiency of other specified B group vitamins: Secondary | ICD-10-CM

## 2016-08-04 MED ORDER — CYANOCOBALAMIN 1000 MCG/ML IJ SOLN
1000.0000 ug | Freq: Once | INTRAMUSCULAR | Status: AC
  Administered 2016-08-04: 1000 ug via INTRAMUSCULAR

## 2016-08-04 NOTE — Progress Notes (Signed)
B12 injection to left deltoid with no apparent complications.   

## 2016-08-08 ENCOUNTER — Other Ambulatory Visit: Payer: Self-pay | Admitting: Neurology

## 2016-09-10 ENCOUNTER — Ambulatory Visit (INDEPENDENT_AMBULATORY_CARE_PROVIDER_SITE_OTHER): Payer: BLUE CROSS/BLUE SHIELD | Admitting: *Deleted

## 2016-09-10 DIAGNOSIS — E538 Deficiency of other specified B group vitamins: Secondary | ICD-10-CM

## 2016-09-10 MED ORDER — CYANOCOBALAMIN 1000 MCG/ML IJ SOLN
1000.0000 ug | Freq: Once | INTRAMUSCULAR | Status: AC
  Administered 2016-09-10: 1000 ug via INTRAMUSCULAR

## 2016-10-09 ENCOUNTER — Ambulatory Visit (INDEPENDENT_AMBULATORY_CARE_PROVIDER_SITE_OTHER): Payer: BLUE CROSS/BLUE SHIELD | Admitting: Neurology

## 2016-10-09 DIAGNOSIS — E538 Deficiency of other specified B group vitamins: Secondary | ICD-10-CM

## 2016-10-09 MED ORDER — CYANOCOBALAMIN 1000 MCG/ML IJ SOLN
1000.0000 ug | Freq: Once | INTRAMUSCULAR | Status: AC
  Administered 2016-10-09: 1000 ug via INTRAMUSCULAR

## 2016-10-09 NOTE — Progress Notes (Signed)
Patient here for B12 injection. Gave patient Cyanocobalamin 1051mcg in L Deltoid. Patient tolerated injection well. Band-Aid applied.

## 2016-12-02 ENCOUNTER — Other Ambulatory Visit: Payer: Self-pay | Admitting: Family Medicine

## 2016-12-02 ENCOUNTER — Ambulatory Visit (INDEPENDENT_AMBULATORY_CARE_PROVIDER_SITE_OTHER): Payer: BLUE CROSS/BLUE SHIELD | Admitting: *Deleted

## 2016-12-02 DIAGNOSIS — E538 Deficiency of other specified B group vitamins: Secondary | ICD-10-CM | POA: Diagnosis not present

## 2016-12-02 DIAGNOSIS — R1010 Upper abdominal pain, unspecified: Secondary | ICD-10-CM

## 2016-12-02 MED ORDER — CYANOCOBALAMIN 1000 MCG/ML IJ SOLN
1000.0000 ug | Freq: Once | INTRAMUSCULAR | Status: AC
  Administered 2016-12-02: 1000 ug via INTRAMUSCULAR

## 2016-12-08 ENCOUNTER — Ambulatory Visit
Admission: RE | Admit: 2016-12-08 | Discharge: 2016-12-08 | Disposition: A | Discharge status: Home / Self Care | Payer: BLUE CROSS/BLUE SHIELD | Source: Ambulatory Visit | Attending: Family Medicine | Admitting: Family Medicine

## 2016-12-08 DIAGNOSIS — R1010 Upper abdominal pain, unspecified: Secondary | ICD-10-CM

## 2017-01-06 ENCOUNTER — Ambulatory Visit (INDEPENDENT_AMBULATORY_CARE_PROVIDER_SITE_OTHER): Payer: BLUE CROSS/BLUE SHIELD | Admitting: *Deleted

## 2017-01-06 DIAGNOSIS — E538 Deficiency of other specified B group vitamins: Secondary | ICD-10-CM

## 2017-01-06 MED ORDER — CYANOCOBALAMIN 1000 MCG/ML IJ SOLN
1000.0000 ug | Freq: Once | INTRAMUSCULAR | Status: AC
  Administered 2017-01-06: 1000 ug via INTRAMUSCULAR

## 2017-02-05 ENCOUNTER — Ambulatory Visit (INDEPENDENT_AMBULATORY_CARE_PROVIDER_SITE_OTHER): Payer: BLUE CROSS/BLUE SHIELD | Admitting: *Deleted

## 2017-02-05 DIAGNOSIS — E538 Deficiency of other specified B group vitamins: Secondary | ICD-10-CM | POA: Diagnosis not present

## 2017-02-05 MED ORDER — CYANOCOBALAMIN 1000 MCG/ML IJ SOLN
1000.0000 ug | Freq: Once | INTRAMUSCULAR | Status: AC
  Administered 2017-02-05: 1000 ug via INTRAMUSCULAR

## 2017-03-15 ENCOUNTER — Ambulatory Visit (INDEPENDENT_AMBULATORY_CARE_PROVIDER_SITE_OTHER): Payer: BLUE CROSS/BLUE SHIELD | Admitting: *Deleted

## 2017-03-15 DIAGNOSIS — E538 Deficiency of other specified B group vitamins: Secondary | ICD-10-CM

## 2017-03-15 MED ORDER — CYANOCOBALAMIN 1000 MCG/ML IJ SOLN
1000.0000 ug | Freq: Once | INTRAMUSCULAR | Status: AC
  Administered 2017-03-15: 1000 ug via INTRAMUSCULAR

## 2017-04-15 ENCOUNTER — Ambulatory Visit (INDEPENDENT_AMBULATORY_CARE_PROVIDER_SITE_OTHER): Payer: BLUE CROSS/BLUE SHIELD | Admitting: *Deleted

## 2017-04-15 DIAGNOSIS — E538 Deficiency of other specified B group vitamins: Secondary | ICD-10-CM | POA: Diagnosis not present

## 2017-04-15 MED ORDER — CYANOCOBALAMIN 1000 MCG/ML IJ SOLN
1000.0000 ug | Freq: Once | INTRAMUSCULAR | Status: AC
  Administered 2017-04-15: 1000 ug via INTRAMUSCULAR

## 2017-04-16 ENCOUNTER — Other Ambulatory Visit: Payer: Self-pay | Admitting: Family Medicine

## 2017-04-16 ENCOUNTER — Other Ambulatory Visit (HOSPITAL_COMMUNITY)
Admission: RE | Admit: 2017-04-16 | Discharge: 2017-04-16 | Disposition: A | Discharge status: Home / Self Care | Payer: BLUE CROSS/BLUE SHIELD | Source: Ambulatory Visit | Attending: Family Medicine | Admitting: Family Medicine

## 2017-04-16 DIAGNOSIS — Z01419 Encounter for gynecological examination (general) (routine) without abnormal findings: Secondary | ICD-10-CM | POA: Insufficient documentation

## 2017-04-22 LAB — CYTOLOGY - PAP
Diagnosis: NEGATIVE
HPV: NOT DETECTED

## 2017-06-01 ENCOUNTER — Ambulatory Visit (INDEPENDENT_AMBULATORY_CARE_PROVIDER_SITE_OTHER): Payer: BLUE CROSS/BLUE SHIELD

## 2017-06-01 ENCOUNTER — Telehealth: Payer: Self-pay | Admitting: Neurology

## 2017-06-01 DIAGNOSIS — E538 Deficiency of other specified B group vitamins: Secondary | ICD-10-CM | POA: Diagnosis not present

## 2017-06-01 MED ORDER — CYANOCOBALAMIN 1000 MCG/ML IJ SOLN
1000.0000 ug | Freq: Once | INTRAMUSCULAR | Status: AC
  Administered 2017-06-01: 1000 ug via INTRAMUSCULAR

## 2017-06-01 NOTE — Telephone Encounter (Signed)
After she has finished with injections, start oral B12 1062mcg daily.  Recheck level in 2 months.

## 2017-06-01 NOTE — Telephone Encounter (Signed)
Wants to know if her next b-12 injection will be the last one and also what does she need to do after her last b-12

## 2017-06-01 NOTE — Telephone Encounter (Signed)
It looks like she should be done with injections.  Can we check a level next month?

## 2017-06-02 ENCOUNTER — Other Ambulatory Visit: Payer: Self-pay | Admitting: *Deleted

## 2017-06-02 DIAGNOSIS — E538 Deficiency of other specified B group vitamins: Secondary | ICD-10-CM

## 2017-06-02 NOTE — Telephone Encounter (Signed)
Left message giving patient instructions and mailed lab order to her.

## 2017-07-06 ENCOUNTER — Ambulatory Visit: Payer: BLUE CROSS/BLUE SHIELD

## 2017-11-02 ENCOUNTER — Telehealth: Payer: Self-pay | Admitting: Neurology

## 2017-11-02 NOTE — Telephone Encounter (Signed)
Pt called and said she is having hip surgery and might not make her April appointment, can she still get her prescriptions filled if she misses that appointment and also does pt still need to be on her B 12 pills please advise

## 2017-11-02 NOTE — Telephone Encounter (Signed)
Informed patient that she will need to reschedule the April appointment and we will recheck her B12 levels at her follow up.  She will continue with supplements.

## 2017-11-08 ENCOUNTER — Encounter: Payer: Self-pay | Admitting: Neurology

## 2017-11-11 ENCOUNTER — Encounter: Payer: Self-pay | Admitting: Neurology

## 2017-11-11 ENCOUNTER — Ambulatory Visit: Payer: BLUE CROSS/BLUE SHIELD | Admitting: Neurology

## 2017-11-11 ENCOUNTER — Telehealth: Payer: Self-pay | Admitting: Neurology

## 2017-11-11 NOTE — Telephone Encounter (Signed)
Place on wait list per Dr. Posey Pronto.

## 2017-11-11 NOTE — Telephone Encounter (Signed)
Please advise 

## 2017-11-11 NOTE — Telephone Encounter (Signed)
Pt will not make appointment today and wants to know if she can be worked in next week, pt wanted me to send a message asking that, please advise

## 2017-11-11 NOTE — Telephone Encounter (Signed)
Unfortunately, I don't have anything available. Please place on wait list.   Donika K. Posey Pronto, DO

## 2017-11-15 ENCOUNTER — Telehealth: Payer: Self-pay

## 2017-11-15 NOTE — Telephone Encounter (Signed)
Notes sent to scheduling.   

## 2017-11-17 ENCOUNTER — Ambulatory Visit (INDEPENDENT_AMBULATORY_CARE_PROVIDER_SITE_OTHER): Payer: BLUE CROSS/BLUE SHIELD | Admitting: Neurology

## 2017-11-17 ENCOUNTER — Encounter: Payer: Self-pay | Admitting: Neurology

## 2017-11-17 VITALS — BP 118/88 | HR 124 | Ht 66.0 in | Wt 192.0 lb

## 2017-11-17 DIAGNOSIS — G35 Multiple sclerosis: Secondary | ICD-10-CM

## 2017-11-17 NOTE — Patient Instructions (Addendum)
MRI brain wwo   MRI cervical spine wwo contrast  Please review information on Gilenya, Tecfidera, and Tysabri  Return to clinic in 3-4 months

## 2017-11-17 NOTE — Progress Notes (Signed)
Follow-up Visit   Date: 11/17/17    Gloria Reeves MRN: [id number] DOB: [birth year]   Interim History: Gloria Reeves is a 47 y.o.  left-handed African American female with asthma, vitamin B12 deficiency, and panic attacks  returning to the clinic for follow-up of multiple sclerosis.  The patient was accompanied to the clinic by self.   History of present illness: Early June 2017, she developed new onset severe biparietal throbbing headache which lasted all day.  She took ibuprofen which did not help, but the headache has resolved by the following morning.  However, the following day, she developed tingling over the left side of the neck, ear, and entire left arm, forearm, and hand.  Symptoms are constant and she has not identified triggers or alleviating factors.  She has throbbing numbness which is exacerbated by movement.  She endorses weakness of the entire left arm and is unable to write, dress herself, or take a shower.  She was evaluated in urgent care who gave her a course of prednisone and flexeril, but denies any improvement.  She was seen by her PCP 3-days ago for these symptoms who referred her for urgent evaluation.   In 2004, she had tingling of the hands and underwent MRI cervical spine and EMG which was essentially normal.    UPDATE 11/17/2017:  She was last seen in 2017 and had extensive evaluation which was consistent with multiple sclerosis, treated with IV steroids, and did not follow-up for further management.  Today, she presents with request for surgical clearnace for right hip arthroplasty due to avascular necrosis.  She reports having improved shooting neck pain after being treated with steroids and no longer has any tingling over the left side of the body.  She has some hand numbness of the hands when she is taking a shower, which lasts about 10 -minutes.  No new neurological symptoms.    Medications:  Current Outpatient Medications on File Prior to Visit    Medication Sig Dispense Refill  . acetaminophen (TYLENOL) 325 MG tablet Take 650 mg by mouth every 6 (six) hours as needed.    . Cyanocobalamin (VITAMIN B-12 PO) Take by mouth.    . gabapentin (NEURONTIN) 300 MG capsule TAKE 1 CAPSULE AT BEDTIME FOR 3 DAYS, THEN INCREASE TO 1 CAPSULE TWICE DAILY. (Patient taking differently: take one daily) 60 capsule 5  . medroxyPROGESTERone (DEPO-PROVERA) 150 MG/ML injection Inject 150 mg into the muscle every 3 (three) months.    . Multiple Vitamin (MULTIVITAMIN) tablet Take 1 tablet by mouth daily.    Marland Kitchen omeprazole (PRILOSEC) 20 MG capsule Take 20 mg by mouth daily.    . Vitamin D, Ergocalciferol, (DRISDOL) 50000 units CAPS capsule Take 1 capsule (50,000 Units total) by mouth every 7 (seven) days. 10 capsule 0   No current facility-administered medications on file prior to visit.     Allergies:  Allergies  Allergen Reactions  . Keppra [Levetiracetam]   . Sulfa Antibiotics     Review of Systems:  CONSTITUTIONAL: No fevers, chills, night sweats, or weight loss.  EYES: No visual changes or eye pain ENT: No hearing changes.  No history of nose bleeds.   RESPIRATORY: No cough, wheezing and shortness of breath.   CARDIOVASCULAR: Negative for chest pain, and palpitations.   GI: Negative for abdominal discomfort, blood in stools or black stools.  No recent change in bowel habits.   GU:  No history of incontinence.   MUSCLOSKELETAL: +history of joint pain  or swelling.  No myalgias.   SKIN: Negative for lesions, rash, and itching.   ENDOCRINE: Negative for cold or heat intolerance, polydipsia or goiter.   PSYCH:  No depression or anxiety symptoms.   NEURO: As Above.   Vital Signs:  BP 118/88   Pulse (!) 124   Ht 5\' 6"  (1.676 m)   Wt 192 lb (87.1 kg)   SpO2 98%   BMI 30.99 kg/m    General: Well appearing, comfortable Neck: no carotid bruit CV: regular rate and rhythm Ext: No edema.  R hip pain and reduced ROM.   Neurological Exam: MENTAL  STATUS including orientation to time, place, person, recent and remote memory, attention span and concentration, language, and fund of knowledge is normal.  Speech is not dysarthric.  CRANIAL NERVES: No visual field defects.  Pupils equal round and reactive to light.  Normal conjugate, extra-ocular eye movements in all directions of gaze.  No ptosis. Normal facial sensation.  Face is symmetric. Palate elevates symmetrically.  Tongue is midline.  MOTOR:  Motor strength is 5/5 in all extremities, except pain-limited weakness of right hip flexion.  No atrophy, fasciculations or abnormal movements.  No pronator drift.  Tone is normal.    MSRs:  Reflexes are 2+/4 throughout. Plantars are down bilaterally.   SENSORY:  Intact to vibration, temperature, and pin prick throughout.  COORDINATION/GAIT:  Normal finger-to- nose-finger and heel-to-shin.  Intact rapid alternating movements bilaterally.  Gait antalgic due to right hip pain.  Data: MRI cervical spine wo contrast 08/05/2003:  There is mild cervical kyphosis. There is a small central disk herniation at C4-5.   MRI cervical spine wwo contrast 02/07/2016:  Enhancing cord lesion on the left at C1. No other cord lesions. This is most likely related to demyelinating disease. Cervical degenerative changes as above.  MRI brain wwo contrast 01/29/2016:    1. Focal area of T2 hyperintensity in the upper cervical spine at the C1-2 level involving the left hemicord. This is concerning for a demyelinating process, raising the possibility of multiple sclerosis, transverse myelitis, or neuromyelitis optica (Devic's disease).  2. Mild periventricular and subcortical T2 changes bilaterally are slightly greater than expected for age. The finding is nonspecific but can be seen in the setting of chronic microvascular ischemia, a demyelinating process such as multiple sclerosis, vasculitis, complicated migraine headaches, or as the sequelae of a prior infectious or  inflammatory process.  3. No other acute intracranial abnormality.  IMPRESSION/PLAN: Relapsing-remitting multiple sclerosis diagnosed in 2017 based on imaging showing C1 demyelinating lesion and mild scattered white matter changes in the brain, as well as supporting CSF findings.  She was treated with solumedrol infusions in the summer of 2017 and has resolution of Lhermitte's sign.  She denies any new neurological symptoms, despite not being on any immunomodulatory therapies.  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   I had lengthy discussion with patient regarding management of MS with disease modifying therapies and educated her on the options of oral agents (Gilenya or Tecfidera) vs. intravenous monthly infusion (Tysabri), especially given her high cervical lesion.  She would like to explore her options and recover from her hip surgery done first.  She is overdue for surveillance imaging, and will order MRI brain and cervical spine wwo contrast.  Continue vitamin D 5000 units daily.   She is  cleared from a neurological stand-point to undergo hip surgery.   Return to clinic in 2-3 months at which time we can decide on maintenance therapy.    Thank you for allowing me to participate in patient's care.  If I can answer any additional questions, I would be pleased to do so.    Sincerely,    Nahomi Hegner K. Posey Pronto, DO

## 2017-11-23 ENCOUNTER — Ambulatory Visit: Payer: BLUE CROSS/BLUE SHIELD | Admitting: Cardiovascular Disease

## 2017-11-23 ENCOUNTER — Encounter: Payer: Self-pay | Admitting: Cardiovascular Disease

## 2017-11-23 VITALS — BP 122/90 | HR 124 | Ht 66.0 in | Wt 204.0 lb

## 2017-11-23 DIAGNOSIS — I519 Heart disease, unspecified: Secondary | ICD-10-CM | POA: Diagnosis not present

## 2017-11-23 DIAGNOSIS — Z01818 Encounter for other preprocedural examination: Secondary | ICD-10-CM | POA: Insufficient documentation

## 2017-11-23 NOTE — Progress Notes (Signed)
11/23/2017 Gloria Reeves   [birth year]  [id number]  Primary Physician London Pepper, MD Primary Cardiologist: Lorretta Harp MD Garret Reddish, [place], [place]  HPI:  Gloria Reeves is a 47 y.o. moderately overweight single female with no children referred by her PCP, Dr. Orland Mustard , for preoperative clearance before elective total hip replacement. She apparently had an abnormal EKG. She has no cardiovascular risk factors other than remote tobacco abuse. She's never had a heart attack or stroke and denies chest pain or shortness of breath. 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 Her EKG in our office shows resting sinus tachycardia. Apparently her thyroid function tests were normal evaluated by her PCP.   Current Meds  Medication Sig  . acetaminophen (TYLENOL) 325 MG tablet Take 650 mg by mouth every 6 (six) hours as needed.  . Cyanocobalamin (VITAMIN B-12 PO) Take by mouth.  . gabapentin (NEURONTIN) 300 MG capsule TAKE 1 CAPSULE AT BEDTIME FOR 3 DAYS, THEN INCREASE TO 1 CAPSULE TWICE DAILY. (Patient taking differently: take one daily)  . medroxyPROGESTERone (DEPO-PROVERA) 150 MG/ML injection Inject 150 mg into the muscle every 3 (three) months.  . Multiple Vitamin (MULTIVITAMIN) tablet Take 1 tablet by mouth daily.  Marland Kitchen omeprazole (PRILOSEC) 20 MG capsule Take 20 mg by mouth daily.  . Vitamin D, Ergocalciferol, (DRISDOL) 50000 units CAPS capsule Take 1 capsule (50,000 Units total) by mouth every 7 (seven) days.     Allergies  Allergen Reactions  . Keppra [Levetiracetam]   . Sulfa Antibiotics     Social History   Socioeconomic History  . Marital status: Married    Spouse name: Not on file  . Number of children: Not on file  . Years of education: Not on file  . Highest education level: Not on file  Occupational History  . Not on file  Social Needs  . Financial resource strain: Not on file  . Food  insecurity:    Worry: Not on file    Inability: Not on file  . Transportation needs:    Medical: Not on file    Non-medical: Not on file  Tobacco Use  . Smoking status: Former Smoker    Types: Cigarettes    Last attempt to quit: 09/05/2010    Years since quitting: 7.2  . Smokeless tobacco: Never Used  Substance and Sexual Activity  . Alcohol use: Yes    Alcohol/week: 0.0 oz    Comment: She drinks 3 beers weekly  . Drug use: No  . Sexual activity: Not on file  Lifestyle  . Physical activity:    Days per week: Not on file    Minutes per session: Not on file  . Stress: Not on file  Relationships  . Social connections:    Talks on phone: Not on file    Gets together: Not on file    Attends religious service: Not on file    Active member of club or organization: Not on file    Attends meetings of clubs or organizations: Not on file    Relationship status: Not on file  . Intimate partner violence:    Fear of current or ex partner: Not on file    Emotionally abused: Not on file    Physically abused: Not on file    Forced sexual activity: Not on file  Other Topics Concern  . Not on file  Social History Narrative   Lives alone  in a one story home.  No children.     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     Review of Systems: General: negative for chills, fever, night sweats or weight changes.  Cardiovascular: negative for chest pain, dyspnea on exertion, edema, orthopnea, palpitations, paroxysmal nocturnal dyspnea or shortness of breath Dermatological: negative for rash Respiratory: negative for cough or wheezing Urologic: negative for hematuria Abdominal: negative for nausea, vomiting, diarrhea, bright red blood per rectum, melena, or hematemesis Neurologic: negative for visual changes, syncope, or dizziness All other systems reviewed and are otherwise negative except as noted above.    Blood pressure 122/90,  pulse (!) 124, height 5\' 6"  (1.676 m), weight 204 lb (92.5 kg).  General appearance: alert and no distress Neck: no adenopathy, no carotid bruit, no JVD, supple, symmetrical, trachea midline and thyroid not enlarged, symmetric, no tenderness/mass/nodules Lungs: clear to auscultation bilaterally Heart: her heart rate is tachycardic although there are normal murmurs or gallops Extremities: extremities normal, atraumatic, no cyanosis or edema Pulses: 2+ and symmetric Skin: Skin color, texture, turgor normal. No rashes or lesions Neurologic: Alert and oriented X 3, normal strength and tone. Normal symmetric reflexes. Normal coordination and gait  EKG . Sinus tachycardia 124 with poor R-wave progression consistent with old anterior infarct.I personally reviewed this EKG.    ASSESSMENT AND PLAN:   Preoperative clearance Ms. Gloria Reeves was referred by Dr. Orland Mustard for preoperative clearance before elective hip surgery.she apparently had an abnormal EKG. She has no critical factors other than remote tobacco abuse. She's never had a stroke and denies chest pain or shortness of breath. She does have stable multiple sclerosis and history of mycosis fungoides. Her EKG today shows sinus tachycardia rate of 124. Apparently her PCP is done routine blood work including thyroid function tests. I am going to get a 2-D echo for LV function. This is normal I will clear her for her upcoming hip replacement at low risk.      Lorretta Harp MD FACP,FACC,FAHA, [facility] 11/23/2017 8:18 AM

## 2017-11-23 NOTE — Assessment & Plan Note (Signed)
Ms. Gloria Reeves was referred by Dr. Orland Mustard for preoperative clearance before elective hip surgery.she apparently had an abnormal EKG. She has no critical factors other than remote tobacco abuse. She's never had a stroke and denies chest pain or shortness of breath. She does have stable multiple sclerosis and history of mycosis fungoides. Her EKG today shows sinus tachycardia rate of 124. Apparently her PCP is done routine blood work including thyroid function tests. I am going to get a 2-D echo for LV function. This is normal I will clear her for her upcoming hip replacement at low risk.

## 2017-11-23 NOTE — Patient Instructions (Signed)
Medication Instructions:  Your physician recommends that you continue on your current medications as directed. Please refer to the Current Medication list given to you today.   Labwork: none  Testing/Procedures: Your physician has requested that you have an echocardiogram. Echocardiography is a painless test that uses sound waves to create images of your heart. It provides your doctor with information about the size and shape of your heart and how well your heart's chambers and valves are working. This procedure takes approximately one hour. There are no restrictions for this procedure.    Follow-Up: Follow up with Dr. Berry as needed.   Any Other Special Instructions Will Be Listed Below (If Applicable).     If you need a refill on your cardiac medications before your next appointment, please call your pharmacy.   

## 2017-11-24 ENCOUNTER — Ambulatory Visit: Payer: BLUE CROSS/BLUE SHIELD | Admitting: Cardiovascular Disease

## 2017-11-26 ENCOUNTER — Other Ambulatory Visit: Payer: Self-pay

## 2017-11-26 ENCOUNTER — Ambulatory Visit (HOSPITAL_COMMUNITY): Payer: BLUE CROSS/BLUE SHIELD | Attending: Cardiology

## 2017-11-26 DIAGNOSIS — I519 Heart disease, unspecified: Secondary | ICD-10-CM | POA: Insufficient documentation

## 2017-11-26 DIAGNOSIS — G35 Multiple sclerosis: Secondary | ICD-10-CM | POA: Diagnosis not present

## 2017-11-26 DIAGNOSIS — R000 Tachycardia, unspecified: Secondary | ICD-10-CM | POA: Diagnosis not present

## 2017-11-26 DIAGNOSIS — Z01818 Encounter for other preprocedural examination: Secondary | ICD-10-CM | POA: Diagnosis present

## 2017-11-26 MED ORDER — PERFLUTREN LIPID MICROSPHERE
1.0000 mL | INTRAVENOUS | Status: AC | PRN
Start: 2017-11-26 — End: 2017-11-26
  Administered 2017-11-26: 2 mL via INTRAVENOUS

## 2017-11-29 ENCOUNTER — Ambulatory Visit: Payer: BLUE CROSS/BLUE SHIELD | Admitting: Cardiology

## 2017-12-01 ENCOUNTER — Encounter: Payer: Self-pay | Admitting: Cardiovascular Disease

## 2017-12-03 ENCOUNTER — Other Ambulatory Visit: Payer: Self-pay | Admitting: Neurology

## 2017-12-06 ENCOUNTER — Ambulatory Visit: Payer: BLUE CROSS/BLUE SHIELD | Admitting: Neurology

## 2018-03-07 ENCOUNTER — Ambulatory Visit: Payer: BLUE CROSS/BLUE SHIELD | Admitting: Neurology

## 2018-03-07 NOTE — Progress Notes (Deleted)
Follow-up Visit   Date: 03/07/18    Gloria Reeves MRN: [id number] DOB: April 12, 1971   Interim History: Gloria Reeves is a 47 y.o.  left-handed African American female with asthma, vitamin B12 deficiency, and panic attacks returning to the clinic for follow-up of multiple sclerosis.  The patient was accompanied to the clinic by self.   History of present illness: Early June 2017, she developed new onset severe biparietal throbbing headache which lasted all day.  She took ibuprofen which did not help, but the headache has resolved by the following morning.  However, the following day, she developed tingling over the left side of the neck, ear, and entire left arm, forearm, and hand.  Symptoms are constant and she has not identified triggers or alleviating factors.  She has throbbing numbness which is exacerbated by movement.  She endorses weakness of the entire left arm and is unable to write, dress herself, or take a shower.  She was evaluated in urgent care who gave her a course of prednisone and flexeril, but denies any improvement.  She was seen by her PCP 3-days ago for these symptoms who referred her for urgent evaluation.   In 2004, she had tingling of the hands and underwent MRI cervical spine and EMG which was essentially normal.    UPDATE 11/17/2017:  She was last seen in 2017 and had extensive evaluation which was consistent with multiple sclerosis, treated with IV steroids, and did not follow-up for further management.  Today, she presents with request for surgical clearnace for right hip arthroplasty due to avascular necrosis.  She reports having improved shooting neck pain after being treated with steroids and no longer has any tingling over the left side of the body.  She has some hand numbness of the hands when she is taking a shower, which lasts about 10 -minutes.  No new neurological symptoms.    UPDATE 03/07/2018:  She is here for follow-up visit.  She did not  schedule MRI brain and c-spine which was recommended at her last visit.    Medications:  Current Outpatient Medications on File Prior to Visit  Medication Sig Dispense Refill  . acetaminophen (TYLENOL) 325 MG tablet Take 650 mg by mouth every 6 (six) hours as needed.    . Cyanocobalamin (VITAMIN B-12 PO) Take by mouth.    . gabapentin (NEURONTIN) 300 MG capsule Take 1 capsule (300 mg total) by mouth 2 (two) times daily. 180 capsule 3  . medroxyPROGESTERone (DEPO-PROVERA) 150 MG/ML injection Inject 150 mg into the muscle every 3 (three) months.    . Multiple Vitamin (MULTIVITAMIN) tablet Take 1 tablet by mouth daily.    Marland Kitchen omeprazole (PRILOSEC) 20 MG capsule Take 20 mg by mouth daily.    . Vitamin D, Ergocalciferol, (DRISDOL) 50000 units CAPS capsule Take 1 capsule (50,000 Units total) by mouth every 7 (seven) days. 10 capsule 0   No current facility-administered medications on file prior to visit.     Allergies:  Allergies  Allergen Reactions  . Keppra [Levetiracetam]   . Sulfa Antibiotics     Review of Systems:  CONSTITUTIONAL: No fevers, chills, night sweats, or weight loss.  EYES: No visual changes or eye pain ENT: No hearing changes.  No history of nose bleeds.   RESPIRATORY: No cough, wheezing and shortness of breath.   CARDIOVASCULAR: Negative for chest pain, and palpitations.   GI: Negative for abdominal discomfort, blood in stools or black stools.  No recent change in  bowel habits.   GU:  No history of incontinence.   MUSCLOSKELETAL: +history of joint pain or swelling.  No myalgias.   SKIN: Negative for lesions, rash, and itching.   ENDOCRINE: Negative for cold or heat intolerance, polydipsia or goiter.   PSYCH:  No depression or anxiety symptoms.   NEURO: As Above.   Vital Signs:  There were no vitals taken for this visit.   General: Well appearing, comfortable Neck: no carotid bruit CV: regular rate and rhythm Ext: No edema.  R hip pain and reduced ROM.    Neurological Exam: MENTAL STATUS including orientation to time, place, person, recent and remote memory, attention span and concentration, language, and fund of knowledge is normal.  Speech is not dysarthric.  CRANIAL NERVES: No visual field defects.  Pupils equal round and reactive to light.  Normal conjugate, extra-ocular eye movements in all directions of gaze.  No ptosis. Normal facial sensation.  Face is symmetric. Palate elevates symmetrically.  Tongue is midline.  MOTOR:  Motor strength is 5/5 in all extremities, except pain-limited weakness of right hip flexion.  No atrophy, fasciculations or abnormal movements.  No pronator drift.  Tone is normal.    MSRs:  Reflexes are 2+/4 throughout. Plantars are down bilaterally.   SENSORY:  Intact to vibration, temperature, and pin prick throughout.  COORDINATION/GAIT:  Normal finger-to- nose-finger and heel-to-shin.  Intact rapid alternating movements bilaterally.  Gait antalgic due to right hip pain.  Data: MRI cervical spine wo contrast 08/05/2003:  There is mild cervical kyphosis. There is a small central disk herniation at C4-5.   MRI cervical spine wwo contrast 02/07/2016:  Enhancing cord lesion on the left at C1. No other cord lesions. This is most likely related to demyelinating disease. Cervical degenerative changes as above.  MRI brain wwo contrast 01/29/2016:    1. Focal area of T2 hyperintensity in the upper cervical spine at the C1-2 level involving the left hemicord. This is concerning for a demyelinating process, raising the possibility of multiple sclerosis, transverse myelitis, or neuromyelitis optica (Devic's disease).  2. Mild periventricular and subcortical T2 changes bilaterally are slightly greater than expected for age. The finding is nonspecific but can be seen in the setting of chronic microvascular ischemia, a demyelinating process such as multiple sclerosis, vasculitis, complicated migraine headaches, or as the  sequelae of a prior infectious or inflammatory process.  3. No other acute intracranial abnormality.  IMPRESSION/PLAN: Relapsing-remitting multiple sclerosis diagnosed in 2017 based on imaging showing C1 demyelinating lesion and mild scattered white matter changes in the brain, as well as supporting CSF findings.  She was treated with solumedrol infusions in the summer of 2017 and has resolution of Lhermitte's sign.  She denies any new neurological symptoms, despite not being on any immunomodulatory therapies.  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   I had lengthy discussion with patient regarding management of MS with disease modifying therapies and educated her on the options of oral agents (Gilenya or Tecfidera) vs. intravenous monthly infusion (Tysabri), especially given her high cervical lesion.  She would like to explore her options and recover from her hip surgery done first.  She is overdue for surveillance imaging, and will order MRI brain and cervical spine wwo contrast.  Continue vitamin D 5000 units daily.   She is cleared from a neurological stand-point to  undergo hip surgery.   Return to clinic in 2-3 months at which time we can decide on maintenance therapy.    Thank you for allowing me to participate in patient's care.  If I can answer any additional questions, I would be pleased to do so.    Sincerely,    Semone Orlov K. Posey Pronto, DO

## 2018-03-16 DIAGNOSIS — K219 Gastro-esophageal reflux disease without esophagitis: Secondary | ICD-10-CM | POA: Diagnosis present

## 2018-03-16 DIAGNOSIS — M87052 Idiopathic aseptic necrosis of left femur: Secondary | ICD-10-CM | POA: Diagnosis present

## 2018-03-16 DIAGNOSIS — M1612 Unilateral primary osteoarthritis, left hip: Secondary | ICD-10-CM | POA: Diagnosis present

## 2018-03-16 NOTE — H&P (Signed)
[facility] [address], [place] [address] [id number] A Division of [facility]  RE:  Gloria Reeves, Gloria Reeves   [id number]    DOB: 08/18/1970 03-09-2018  Reason for visit:  Continued evaluation left hip pain.  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  Previous surgery 01-13-2018.  HPI: Gloria Reeves is a 47 year old female with a history of MS who has remotely taken high dose steroids for this.  Gloria Reeves presents today for continued evaluation of osteoarthritis and MRI-confirmed AVN of Gloria Reeves left hip.  Gloria Reeves has failed nonsurgical conservative treatments for greater than 12 weeks to include NSAIDS, corticosteroid injections, and activity modification.  Gloria Reeves has night pain, worsening pain with activity, and weight bearing and pain that significantly interferes with Gloria Reeves ADLs and work.  MRI of both hips prior to Gloria Reeves right total hip arthroplasty showed AVN bilaterally.  There is no active infection.    Past medical history:  MS. Past surgical history:  Right total hip arthroplasty 01-13-2018 by Dr. Fredonia Highland.  Allergies:  Sulfa - unknown allergy.  Current medications:  Omeprazole 20 mg q.d., vitamin D3 and B12 q.d., methotrexate 2.5 mg q.o.d, folic acid 768 micrograms q.o.d., Tylenol 500 mg OTC p.r.n., multivitamin daily.  Family history:  Mother with hypertension. Grandparents with heart disease, history of CVA. Social history:  Gloria Reeves is a former smoker - quit 10 years ago.  No ETOH.  Gloria Reeves, but has good support from Gloria Reeves family.    Review of systems:  Gloria Reeves current denies lightheadedness, dizziness, fever, chills, chest pain, shortness of breath. No personal history of DVT, PE, MI, or CVA.  No loose teeth or dentures.  All other systems are reviewed and otherwise currently negative with the exception of those mentioned in the HPI as above.  OBJECTIVE: Vitals:  HT: 5\' 8" ;  WT: 200; BP: 157/94; TEMP: 98.2; PULSE: 101; O2: 99% on room air. Physical exam:  Alert; in no acute distress.  Walks unaided with a Trendelenburg gait.  EOMI. Good neck extension. No increased work with breathing.  Lungs are clear to auscultation anterior and posterior without crackle or wheeze.  Heart is regular rate and rhythm without murmur appreciated.  Abdomen is soft, nontender, and nondistended.  Neuro without gross focal deficit.  Sensation intact distally.  No lesions in the area of chief complaint.   Left hip:  Painful Stinchfield with some moderate greater trochanteric discomfort with palpation.  No midline or paraspinal muscular back pain.  Pain with range of motion and weight bearing left hip.    IMAGES: MRI shows severe AVN bilateral hips - this was prior to Gloria Reeves right total hip arthroplasty.     ASSESSMENT/PLAN: Left hip AVN.  Patient history, physical exam, clinical judgment of the provider, and images are consistent with end-stage degenerative joint disease, and total joint arthroplasty is deemed medically necessary.  Treatment options including medical management, injection therapy, and arthroplasty were discussed at length.  The risks of nonoperative treatment versus surgical intervention including, but not limited to, continued pain, aseptic loosening, dislocation/subluxation, infection, bleeding, nerve injury, blood clots, cardiopulmonary complications, morbidity, and mortality, among others, were discussed.  The patient verbalized understanding and wishes to proceed.  The patient is being admitted for surgery, pain control, PT, prophylactic antibiotics, VTE prophylaxis, progressive ambulation, ADLs, and discharge planning.  Dental prophylaxis was discussed and recommended for two years postoperatively.    The patient does  meet criteria for TXA which will be used perioperatively.  ASA 81 mg b.i.d. will be used postoperatively for DVT prophylaxis in addition to SCDs and ambulation.     Plan for Tylenol, Celebrex, gabapentin, oxycodone for pain postoperatively.   The patient is planned to be discharged home likely with home health PT.  We did discuss virtual PT with Gloria Reeves as well as getting out of the house with the significant number of stairs have been difficult for Gloria Reeves.  At this point, Gloria Reeves would be more comfortable with a few home health visits and no outpatient physical therapy.  Ernesta Amble.  Percell Miller, M.D. Dictated by: Lemar Lofty, PA-C  Electronically verified by Ernesta Amble Percell Miller, M.D. TDM(HCM):bwb D 03-09-2018 T 03-14-2018

## 2018-03-17 ENCOUNTER — Encounter (HOSPITAL_COMMUNITY): Payer: Self-pay

## 2018-03-17 ENCOUNTER — Encounter (HOSPITAL_COMMUNITY)
Admission: RE | Admit: 2018-03-17 | Discharge: 2018-03-17 | Disposition: A | Discharge status: Home / Self Care | Payer: BLUE CROSS/BLUE SHIELD | Source: Ambulatory Visit | Attending: Orthopedic Surgery | Admitting: Orthopedic Surgery

## 2018-03-17 ENCOUNTER — Other Ambulatory Visit: Payer: Self-pay

## 2018-03-17 DIAGNOSIS — Z01812 Encounter for preprocedural laboratory examination: Secondary | ICD-10-CM | POA: Diagnosis not present

## 2018-03-17 HISTORY — DX: Malignant (primary) neoplasm, unspecified: C80.1

## 2018-03-17 HISTORY — DX: Mycosis fungoides, unspecified site: C84.00

## 2018-03-17 HISTORY — DX: Cardiac arrhythmia, unspecified: I49.9

## 2018-03-17 LAB — BASIC METABOLIC PANEL
ANION GAP: 11 (ref 5–15)
BUN: 9 mg/dL (ref 6–20)
CO2: 22 mmol/L (ref 22–32)
Calcium: 9.1 mg/dL (ref 8.9–10.3)
Chloride: 108 mmol/L (ref 98–111)
Creatinine, Ser: 0.79 mg/dL (ref 0.44–1.00)
GFR calc Af Amer: >60 mL/min (ref >60)
GLUCOSE: 102 mg/dL — AB (ref 70–99)
POTASSIUM: 4.1 mmol/L (ref 3.5–5.1)
Sodium: 141 mmol/L (ref 135–145)

## 2018-03-17 LAB — CBC
HEMATOCRIT: 41.8 % (ref 36.0–46.0)
HEMOGLOBIN: 13.5 g/dL (ref 12.0–15.0)
MCH: 29.4 pg (ref 26.0–34.0)
MCHC: 32.3 g/dL (ref 30.0–36.0)
MCV: 91.1 fL (ref 78.0–100.0)
Platelets: 337 10*3/uL (ref 150–400)
RBC: 4.59 MIL/uL (ref 3.87–5.11)
RDW: 15.8 % — ABNORMAL HIGH (ref 11.5–15.5)
WBC: 9.5 10*3/uL (ref 4.0–10.5)

## 2018-03-17 LAB — SURGICAL PCR SCREEN
MRSA, PCR: NEGATIVE
STAPHYLOCOCCUS AUREUS: POSITIVE — AB

## 2018-03-17 NOTE — Progress Notes (Signed)
Mupirocin Ointment Rx called into [facility] on [address] for positive PCR of Staph. Pt notified of results and need to pick up Rx.

## 2018-03-17 NOTE — Pre-Procedure Instructions (Signed)
Gloria Reeves  03/17/2018      [id number] [address],  - [place] [address] Phone: [phone] Fax: [phone]    Your procedure is scheduled on Tuesday, Aug. 13th   Report to [facility] Admitting at 5:30AM             (posted surgery time 7:30a - 9:30a)   Call this number if you have problems the morning of surgery:  [phone]   Remember:   Do not eat any food or drink any liquids after midnight, Monday.              4-5 days prior to surgery, STOP TAKING any Vitamins, Herbal Supplements, Anti-inflammatories.   Take these medicines the morning of surgery with A SIP OF WATER : Gabapentin, Omeprazole    Do not wear jewelry, make-up or nail polish.  Do not wear lotions, powders,  perfumes, or deodorant.  Do not shave 48 hours prior to surgery.    Do not bring valuables to the hospital.  [facility] is not responsible for any belongings or valuables.  Contacts, dentures or bridgework may not be worn into surgery.  Leave your suitcase in the car.  After surgery it may be brought to your room.  For patients admitted to the hospital, discharge time will be determined by your treatment team.  Please read over the following fact sheets that you were given. Pain Booklet, MRSA Information and Surgical Site Infection Prevention     [place]- Preparing For Surgery  Before surgery, you can play an important role. Because skin is not sterile, your skin needs to be as free of germs as possible. You can reduce the number of germs on your skin by washing with CHG (chlorahexidine gluconate) Soap before surgery.  CHG is an antiseptic cleaner which kills germs and bonds with the skin to continue killing germs even after washing.    Oral Hygiene is also important to reduce your risk of infection.    Remember - BRUSH YOUR TEETH THE MORNING OF SURGERY WITH YOUR REGULAR TOOTHPASTE  Please do not use if  you have an allergy to CHG or antibacterial soaps. If your skin becomes reddened/irritated stop using the CHG.  Do not shave (including legs and underarms) for at least 48 hours prior to first CHG shower. It is OK to shave your face.  Please follow these instructions carefully.   1. Shower the NIGHT BEFORE SURGERY and the MORNING OF SURGERY with CHG.   2. If you chose to wash your hair, wash your hair first as usual with your normal shampoo.  3. After you shampoo, rinse your hair and body thoroughly to remove the shampoo.  4. Use CHG as you would any other liquid soap. You can apply CHG directly to the skin and wash gently with a scrungie or a clean washcloth.   5. Apply the CHG Soap to your body ONLY FROM THE NECK DOWN.  Do not use on open wounds or open sores. Avoid contact with your eyes, ears, mouth and genitals (private parts). Wash Face and genitals (private parts)  with your normal soap.  6. Wash thoroughly, paying special attention to the area where your surgery will be performed.  7. Thoroughly rinse your body with warm water from the neck down.  8. DO NOT shower/wash with your normal soap after using and rinsing off the CHG Soap.  9. Pat yourself dry  with a CLEAN TOWEL.  10. Wear CLEAN PAJAMAS to bed the night before surgery, wear comfortable clothes the morning of surgery  11. Place CLEAN SHEETS on your bed the night of your first shower and DO NOT SLEEP WITH PETS.  Day of Surgery:  Do not apply any deodorants/lotions.  Please wear clean clothes to the hospital/surgery center.    Remember to brush your teeth WITH YOUR REGULAR TOOTHPASTE.

## 2018-03-17 NOTE — Progress Notes (Addendum)
PCP is Dr. Leia Alf @ [facility].  LOV  Per pt was 10/2017  (I have requested last office notes, any notes regarding her thyroid/ studies or tsh levels)  [phone] Cardio Dr. Adora Fridge    LOV  11/2017 for LV dysfunction and note. (she had previous hip surgery and needed workup prior to) Echo 11/2017 Currently denies any cp, sob.

## 2018-03-18 NOTE — Progress Notes (Addendum)
Anesthesia Chart Review:  Case:  [id number] Date/Time:  03/29/18 0715   Procedure:  LEFT TOTAL HIP ARTHROPLASTY ANTERIOR APPROACH (Left Hip)   Anesthesia type:  Choice   Pre-op diagnosis:  OA LEFT HIP   Location:  [facility] OR ROOM 10 / [place] OR   Surgeon:  Renette Butters, MD      DISCUSSION: Patient is a 47 year old female scheduled for the above procedure. History includes former smoker (quit '12), anorexia, panic disorder, ETOH abuse (now reports only 3 beers weekly), Multiple sclerosis (relapsing-remitting), childhood asthma, mycosis fungoides, thyroid nodule. Findings consistent with fatty infiltration of the liver was noted on 12/08/16 abdominal U/S. She is s/p right THA in 01/13/18.   She was referred to cardiologist Quay Burow, MD in 11/2017 for preoperative evaluation prior to her initial THA due to abnormal EKG/tachycardia.  Dr. Orland Mustard had checked a TSH on 04/03/17 that was normal at 0.501. Dr. Gwenlyn Found ordered an echocardiogram which was normal, and she was subsequently cleared for surgery.   Patient had recent cardiology evaluation for tachycardia. She is still mildly tachycardic, but less so than her last cardiology visit. She tolerated recent right THA, so based on currently available information I would anticipate that she can proceed as planned.    VS: BP (!) 140/97 Comment: notified Debbie RN  Pulse (!) 107 Comment: notified Debbie RN  Temp 36.7 C   Resp 20   Ht 5\' 6"  (1.676 m)   Wt 200 lb (90.7 kg)   SpO2 95%   BMI 32.28 kg/m   PROVIDERS: London Pepper, MD is PCP.  Quay Burow, MD is cardiologist. Adelene Idler, MD is dermatologist (for Mycosis fungoides). See [facility]. Last visit 02/14/18. Started on methotrexate once weekly and folic acid. Using Valcholor and triamcinolone for active lesions. Submitted request for home NBUVB phototherapy. Narda Amber, DO is neurologist. Last visit 11/17/17. Dr. Posey Pronto cleared patient from a neurology standpoint prior to  patient undergoing right THR earlier this year (scanned under Media tab).    LABS: Labs reviewed: Acceptable for surgery. LFTs were WNL on 02/14/18 ([place]). (all labs ordered are listed, but only abnormal results are displayed)  Labs Reviewed  SURGICAL PCR SCREEN - Abnormal; Notable for the following components:      Result Value   Staphylococcus aureus POSITIVE (*)    All other components within normal limits  BASIC METABOLIC PANEL - Abnormal; Notable for the following components:   Glucose, Bld 102 (*)    All other components within normal limits  CBC - Abnormal; Notable for the following components:   RDW 15.8 (*)    All other components within normal limits    IMAGES: Thyroid U/S 11/13/11: Findings: Right thyroid lobe:  5.8 x 1.5 x 2.1 cm Left thyroid lobe:  4.9 x 1.6 x 2.2 cm Isthmus:  0.3 cm Focal nodules:  Solid nodule  left mid pole measures 13 x 7 x 11 mm and is similar to the prior study.  3.4 mm cystic nodule in the left lower pole.  4 mm cystic nodule right upper pole is unchanged. Lymphadenopathy:  Right neck lymph node measures 7 mm.  Left neck lymph node measures 7.6 mm. IMPRESSION: - Solid nodule on the left is unchanged.  No new nodules are present. - Cervical lymph nodes are present bilaterally which are not pathologically enlarged.   EKG: EKG 11/23/17: ST at 124 bpm. HR at PAT 107 bpm.    CV: Echo  11/26/17: Study Conclusions - Left ventricle: The cavity size was normal. There was mild focal   basal hypertrophy of the septum. Systolic function was normal.   The estimated ejection fraction was in the range of 55% to 60%.   Wall motion was normal; there were no regional wall motion   abnormalities. Left ventricular diastolic function parameters   were normal.   Past Medical History:  Diagnosis Date  . Alcohol abuse   . Anorexia   . Asthma    AS CHILD  . Cancer [facility])    SKIN CANCER  . Dysrhythmia    HAD INCREASED HR FOR AWHILE NOW.    ST 01/2018 EKG  . Esophageal reflux   . Migraine   . Mood disorder ([place])   . Multiple sclerosis, relapsing-remitting ([place]) 03/19/2016  . Mycosis fungoides ([place])    goes to [facility] for this  . Panic disorder   . Seasonal allergies   . Thyroid nodule     Past Surgical History:  Procedure Laterality Date  . JOINT REPLACEMENT     RIGHT HIP REPLACEMENT   01/13/2018  . None      MEDICATIONS: . acetaminophen (TYLENOL) 325 MG tablet  . folic acid (FOLVITE) 1 MG tablet  . gabapentin (NEURONTIN) 300 MG capsule  . medroxyPROGESTERone (DEPO-PROVERA) 150 MG/ML injection  . meloxicam (MOBIC) 15 MG tablet  . methotrexate (RHEUMATREX) 2.5 MG tablet  . Multiple Vitamin (MULTIVITAMIN) tablet  . omeprazole (PRILOSEC) 20 MG capsule  . Vitamin D, Ergocalciferol, (DRISDOL) 50000 units CAPS capsule   No current facility-administered medications for this encounter.     [facility]/Anesthesiology Phone [phone] 03/21/2018 1:33 PM

## 2018-03-28 MED ORDER — TRANEXAMIC ACID 1000 MG/10ML IV SOLN
2000.0000 mg | INTRAVENOUS | Status: AC
  Administered 2018-03-29: 2000 mg via TOPICAL
  Filled 2018-03-28: qty 20

## 2018-03-28 MED ORDER — TRANEXAMIC ACID 1000 MG/10ML IV SOLN
1000.0000 mg | INTRAVENOUS | Status: AC
  Administered 2018-03-29: 1000 mg via INTRAVENOUS
  Filled 2018-03-28: qty 1100

## 2018-03-28 MED ORDER — BUPIVACAINE LIPOSOME 1.3 % IJ SUSP
20.0000 mL | INTRAMUSCULAR | Status: DC
  Filled 2018-03-28: qty 20

## 2018-03-28 NOTE — Anesthesia Preprocedure Evaluation (Addendum)
Anesthesia Evaluation    Reviewed: Allergy & Precautions, Patient's Chart, lab work & pertinent test results  Airway Mallampati: III  TM Distance: >3 FB Neck ROM: Full  Mouth opening: Limited Mouth Opening  Dental  (+) Teeth Intact, Dental Advisory Given   Pulmonary asthma , former smoker,    breath sounds clear to auscultation       Cardiovascular negative cardio ROS   Rhythm:Regular Rate:Normal  TTE 11/2017 EF 55-60%, no valvular abnormalities   Neuro/Psych  Headaches, Anxiety Multiple sclerosis, relasping-remitting, stable    GI/Hepatic Neg liver ROS, GERD  Medicated,  Endo/Other  negative endocrine ROS  Renal/GU negative Renal ROS     Musculoskeletal negative musculoskeletal ROS (+) Arthritis , Osteoarthritis,    Abdominal   Peds  Hematology negative hematology ROS (+)   Anesthesia Other Findings Day of surgery medications reviewed with the patient.  Reproductive/Obstetrics                            Anesthesia Physical Anesthesia Plan  ASA: III  Anesthesia Plan: Spinal and General   Post-op Pain Management:    Induction: Intravenous  PONV Risk Score and Plan: 2 and Propofol infusion, Dexamethasone, Ondansetron and Midazolam  Airway Management Planned: Natural Airway and Simple Face Mask  Additional Equipment:   Intra-op Plan:   Post-operative Plan:   Informed Consent: I have reviewed the patients History and Physical, chart, labs and discussed the procedure including the risks, benefits and alternatives for the proposed anesthesia with the patient or authorized representative who has indicated his/her understanding and acceptance.   Dental advisory given  Plan Discussed with: CRNA  Anesthesia Plan Comments:        Anesthesia Quick Evaluation

## 2018-03-29 ENCOUNTER — Encounter (HOSPITAL_COMMUNITY): Payer: Self-pay | Admitting: *Deleted

## 2018-03-29 ENCOUNTER — Inpatient Hospital Stay (HOSPITAL_COMMUNITY)
Admission: AD | Admit: 2018-03-29 | Discharge: 2018-03-31 | DRG: 470 | Disposition: A | Discharge status: Home Health | Payer: BLUE CROSS/BLUE SHIELD | Attending: Orthopedic Surgery | Admitting: Orthopedic Surgery

## 2018-03-29 ENCOUNTER — Ambulatory Visit (HOSPITAL_COMMUNITY): Payer: BLUE CROSS/BLUE SHIELD | Admitting: Vascular Surgery

## 2018-03-29 ENCOUNTER — Other Ambulatory Visit: Payer: Self-pay

## 2018-03-29 ENCOUNTER — Ambulatory Visit (HOSPITAL_COMMUNITY): Payer: BLUE CROSS/BLUE SHIELD

## 2018-03-29 ENCOUNTER — Encounter (HOSPITAL_COMMUNITY)
Admission: AD | Disposition: A | Discharge status: Home Health | Payer: Self-pay | Source: Home / Self Care | Attending: Orthopedic Surgery

## 2018-03-29 ENCOUNTER — Ambulatory Visit (HOSPITAL_COMMUNITY): Payer: BLUE CROSS/BLUE SHIELD | Admitting: Anesthesiology

## 2018-03-29 DIAGNOSIS — Z8249 Family history of ischemic heart disease and other diseases of the circulatory system: Secondary | ICD-10-CM

## 2018-03-29 DIAGNOSIS — M87852 Other osteonecrosis, left femur: Secondary | ICD-10-CM | POA: Diagnosis present

## 2018-03-29 DIAGNOSIS — Z823 Family history of stroke: Secondary | ICD-10-CM

## 2018-03-29 DIAGNOSIS — Z419 Encounter for procedure for purposes other than remedying health state, unspecified: Secondary | ICD-10-CM

## 2018-03-29 DIAGNOSIS — Z85828 Personal history of other malignant neoplasm of skin: Secondary | ICD-10-CM | POA: Diagnosis not present

## 2018-03-29 DIAGNOSIS — M1612 Unilateral primary osteoarthritis, left hip: Secondary | ICD-10-CM | POA: Diagnosis present

## 2018-03-29 DIAGNOSIS — C8409 Mycosis fungoides, extranodal and solid organ sites: Secondary | ICD-10-CM | POA: Diagnosis present

## 2018-03-29 DIAGNOSIS — I10 Essential (primary) hypertension: Secondary | ICD-10-CM | POA: Diagnosis present

## 2018-03-29 DIAGNOSIS — M1610 Unilateral primary osteoarthritis, unspecified hip: Secondary | ICD-10-CM | POA: Diagnosis present

## 2018-03-29 DIAGNOSIS — K219 Gastro-esophageal reflux disease without esophagitis: Secondary | ICD-10-CM | POA: Diagnosis present

## 2018-03-29 DIAGNOSIS — Z87891 Personal history of nicotine dependence: Secondary | ICD-10-CM | POA: Diagnosis not present

## 2018-03-29 DIAGNOSIS — Z79899 Other long term (current) drug therapy: Secondary | ICD-10-CM | POA: Diagnosis not present

## 2018-03-29 DIAGNOSIS — Z96641 Presence of right artificial hip joint: Secondary | ICD-10-CM | POA: Diagnosis present

## 2018-03-29 DIAGNOSIS — M87052 Idiopathic aseptic necrosis of left femur: Secondary | ICD-10-CM | POA: Diagnosis present

## 2018-03-29 DIAGNOSIS — G35 Multiple sclerosis: Secondary | ICD-10-CM | POA: Diagnosis present

## 2018-03-29 HISTORY — PX: TOTAL HIP ARTHROPLASTY: SHX124

## 2018-03-29 LAB — POCT PREGNANCY, URINE: PREG TEST UR: NEGATIVE

## 2018-03-29 SURGERY — ARTHROPLASTY, HIP, TOTAL, ANTERIOR APPROACH
Anesthesia: Monitor Anesthesia Care | Site: Hip | Laterality: Left

## 2018-03-29 MED ORDER — PHENOL 1.4 % MT LIQD: 1.0000 | OROMUCOSAL | Status: DC | PRN

## 2018-03-29 MED ORDER — EPHEDRINE 5 MG/ML INJ
INTRAVENOUS | Status: AC
  Filled 2018-03-29: qty 10

## 2018-03-29 MED ORDER — DEXAMETHASONE SODIUM PHOSPHATE 10 MG/ML IJ SOLN
INTRAMUSCULAR | Status: AC
  Filled 2018-03-29: qty 1

## 2018-03-29 MED ORDER — CEFAZOLIN SODIUM-DEXTROSE 2-4 GM/100ML-% IV SOLN
2.0000 g | INTRAVENOUS | Status: AC
  Administered 2018-03-29: 2 g via INTRAVENOUS
  Filled 2018-03-29: qty 100

## 2018-03-29 MED ORDER — PROPOFOL 1000 MG/100ML IV EMUL
INTRAVENOUS | Status: AC
  Filled 2018-03-29: qty 100

## 2018-03-29 MED ORDER — DOCUSATE SODIUM 100 MG PO CAPS: 100.0000 mg | ORAL_CAPSULE | Freq: Two times a day (BID) | ORAL | 0 refills | Status: AC

## 2018-03-29 MED ORDER — ONDANSETRON HCL 4 MG/2ML IJ SOLN
INTRAMUSCULAR | Status: DC | PRN
  Administered 2018-03-29: 4 mg via INTRAVENOUS

## 2018-03-29 MED ORDER — METHOCARBAMOL 1000 MG/10ML IJ SOLN
500.0000 mg | Freq: Four times a day (QID) | INTRAVENOUS | Status: DC | PRN
  Filled 2018-03-29: qty 5

## 2018-03-29 MED ORDER — METHOCARBAMOL 500 MG PO TABS
ORAL_TABLET | ORAL | Status: AC
  Filled 2018-03-29: qty 1

## 2018-03-29 MED ORDER — DEXAMETHASONE SODIUM PHOSPHATE 10 MG/ML IJ SOLN
10.0000 mg | Freq: Once | INTRAMUSCULAR | Status: AC
  Administered 2018-03-30: 10 mg via INTRAVENOUS
  Filled 2018-03-29: qty 1

## 2018-03-29 MED ORDER — PHENYLEPHRINE 40 MCG/ML (10ML) SYRINGE FOR IV PUSH (FOR BLOOD PRESSURE SUPPORT)
PREFILLED_SYRINGE | INTRAVENOUS | Status: AC
  Filled 2018-03-29: qty 10

## 2018-03-29 MED ORDER — FOLIC ACID 1 MG PO TABS
1.0000 mg | ORAL_TABLET | Freq: Every day | ORAL | Status: DC
  Administered 2018-03-29 – 2018-03-31 (×3): 1 mg via ORAL
  Filled 2018-03-29 (×3): qty 1

## 2018-03-29 MED ORDER — GABAPENTIN 300 MG PO CAPS
300.0000 mg | ORAL_CAPSULE | Freq: Once | ORAL | Status: AC
  Administered 2018-03-29: 300 mg via ORAL
  Filled 2018-03-29: qty 1

## 2018-03-29 MED ORDER — ACETAMINOPHEN 325 MG PO TABS
325.0000 mg | ORAL_TABLET | Freq: Four times a day (QID) | ORAL | Status: DC | PRN
  Administered 2018-03-30 – 2018-03-31 (×4): 650 mg via ORAL
  Filled 2018-03-29 (×5): qty 2

## 2018-03-29 MED ORDER — CELECOXIB 200 MG PO CAPS
200.0000 mg | ORAL_CAPSULE | Freq: Two times a day (BID) | ORAL | Status: DC
  Administered 2018-03-29 – 2018-03-30 (×4): 200 mg via ORAL
  Filled 2018-03-29 (×4): qty 1

## 2018-03-29 MED ORDER — MIDAZOLAM HCL 5 MG/5ML IJ SOLN
INTRAMUSCULAR | Status: DC | PRN
  Administered 2018-03-29 (×2): 0.5 mg via INTRAVENOUS
  Administered 2018-03-29 (×3): 1 mg via INTRAVENOUS

## 2018-03-29 MED ORDER — OXYCODONE HCL 5 MG PO TABS: 5.0000 mg | ORAL_TABLET | ORAL | 0 refills | Status: AC | PRN

## 2018-03-29 MED ORDER — METOCLOPRAMIDE HCL 5 MG/ML IJ SOLN: 5.0000 mg | Freq: Three times a day (TID) | INTRAMUSCULAR | Status: DC | PRN

## 2018-03-29 MED ORDER — DIPHENHYDRAMINE HCL 12.5 MG/5ML PO ELIX: 12.5000 mg | ORAL_SOLUTION | ORAL | Status: DC | PRN

## 2018-03-29 MED ORDER — MAGNESIUM CITRATE PO SOLN: 1.0000 | Freq: Once | ORAL | Status: DC | PRN

## 2018-03-29 MED ORDER — CELECOXIB 200 MG PO CAPS: 200.0000 mg | ORAL_CAPSULE | Freq: Two times a day (BID) | ORAL | 0 refills | Status: DC

## 2018-03-29 MED ORDER — ESMOLOL HCL 100 MG/10ML IV SOLN
INTRAVENOUS | Status: AC
  Filled 2018-03-29: qty 10

## 2018-03-29 MED ORDER — SORBITOL 70 % SOLN: 30.0000 mL | Freq: Every day | Status: DC | PRN

## 2018-03-29 MED ORDER — SODIUM CHLORIDE FLUSH 0.9 % IV SOLN
INTRAVENOUS | Status: DC | PRN
  Administered 2018-03-29: 50 mL via INTRAVENOUS

## 2018-03-29 MED ORDER — ROCURONIUM BROMIDE 10 MG/ML (PF) SYRINGE
PREFILLED_SYRINGE | INTRAVENOUS | Status: AC
  Filled 2018-03-29: qty 10

## 2018-03-29 MED ORDER — FENTANYL CITRATE (PF) 250 MCG/5ML IJ SOLN
INTRAMUSCULAR | Status: AC
  Filled 2018-03-29: qty 5

## 2018-03-29 MED ORDER — CEFAZOLIN SODIUM-DEXTROSE 1-4 GM/50ML-% IV SOLN
1.0000 g | Freq: Four times a day (QID) | INTRAVENOUS | Status: AC
  Administered 2018-03-29 (×2): 1 g via INTRAVENOUS
  Filled 2018-03-29 (×2): qty 50

## 2018-03-29 MED ORDER — ASPIRIN 81 MG PO CHEW
81.0000 mg | CHEWABLE_TABLET | Freq: Two times a day (BID) | ORAL | Status: DC
  Administered 2018-03-29 – 2018-03-31 (×4): 81 mg via ORAL
  Filled 2018-03-29 (×4): qty 1

## 2018-03-29 MED ORDER — BUPIVACAINE LIPOSOME 1.3 % IJ SUSP
INTRAMUSCULAR | Status: DC | PRN
  Administered 2018-03-29: 20 mL

## 2018-03-29 MED ORDER — PROPOFOL 500 MG/50ML IV EMUL
INTRAVENOUS | Status: DC | PRN
  Administered 2018-03-29: 25 ug/kg/min via INTRAVENOUS

## 2018-03-29 MED ORDER — PROPOFOL 10 MG/ML IV BOLUS
INTRAVENOUS | Status: AC
  Filled 2018-03-29: qty 20

## 2018-03-29 MED ORDER — ONDANSETRON HCL 4 MG/2ML IJ SOLN
4.0000 mg | Freq: Four times a day (QID) | INTRAMUSCULAR | Status: DC | PRN
  Administered 2018-03-29: 4 mg via INTRAVENOUS
  Filled 2018-03-29: qty 2

## 2018-03-29 MED ORDER — ACETAMINOPHEN 500 MG PO TABS
1000.0000 mg | ORAL_TABLET | Freq: Four times a day (QID) | ORAL | Status: AC
  Administered 2018-03-29 – 2018-03-30 (×3): 1000 mg via ORAL
  Filled 2018-03-29 (×3): qty 2

## 2018-03-29 MED ORDER — ONDANSETRON HCL 4 MG PO TABS
4.0000 mg | ORAL_TABLET | Freq: Four times a day (QID) | ORAL | Status: DC | PRN
  Administered 2018-03-30 – 2018-03-31 (×2): 4 mg via ORAL
  Filled 2018-03-29 (×2): qty 1

## 2018-03-29 MED ORDER — MIDAZOLAM HCL 2 MG/2ML IJ SOLN
INTRAMUSCULAR | Status: AC
  Filled 2018-03-29: qty 2

## 2018-03-29 MED ORDER — PANTOPRAZOLE SODIUM 40 MG PO TBEC
40.0000 mg | DELAYED_RELEASE_TABLET | Freq: Every day | ORAL | Status: DC
  Administered 2018-03-29 – 2018-03-31 (×3): 40 mg via ORAL
  Filled 2018-03-29 (×3): qty 1

## 2018-03-29 MED ORDER — ESMOLOL HCL 100 MG/10ML IV SOLN
INTRAVENOUS | Status: DC | PRN
  Administered 2018-03-29 (×3): 10 mg via INTRAVENOUS

## 2018-03-29 MED ORDER — ACETAMINOPHEN 500 MG PO TABS: 1000.0000 mg | ORAL_TABLET | Freq: Three times a day (TID) | ORAL | 0 refills | Status: AC

## 2018-03-29 MED ORDER — ACETAMINOPHEN 500 MG PO TABS
1000.0000 mg | ORAL_TABLET | Freq: Once | ORAL | Status: AC
  Administered 2018-03-29: 1000 mg via ORAL
  Filled 2018-03-29: qty 2

## 2018-03-29 MED ORDER — BUPIVACAINE IN DEXTROSE 0.75-8.25 % IT SOLN
INTRATHECAL | Status: DC | PRN
  Administered 2018-03-29: 2 mL via INTRATHECAL

## 2018-03-29 MED ORDER — HYDROMORPHONE HCL 1 MG/ML IJ SOLN: 0.2500 mg | INTRAMUSCULAR | Status: DC | PRN

## 2018-03-29 MED ORDER — ONDANSETRON HCL 4 MG PO TABS: 4.0000 mg | ORAL_TABLET | Freq: Three times a day (TID) | ORAL | 0 refills | Status: AC | PRN

## 2018-03-29 MED ORDER — LACTATED RINGERS IV SOLN
INTRAVENOUS | Status: DC
  Administered 2018-03-29: 13:00:00 via INTRAVENOUS

## 2018-03-29 MED ORDER — GABAPENTIN 300 MG PO CAPS: 300.0000 mg | ORAL_CAPSULE | Freq: Two times a day (BID) | ORAL | 0 refills | Status: DC

## 2018-03-29 MED ORDER — ASPIRIN EC 81 MG PO TBEC: 81.0000 mg | DELAYED_RELEASE_TABLET | Freq: Two times a day (BID) | ORAL | 0 refills | Status: AC

## 2018-03-29 MED ORDER — METHOCARBAMOL 500 MG PO TABS: 500.0000 mg | ORAL_TABLET | Freq: Four times a day (QID) | ORAL | 0 refills | Status: AC | PRN

## 2018-03-29 MED ORDER — OXYCODONE HCL 5 MG PO TABS
5.0000 mg | ORAL_TABLET | ORAL | Status: DC | PRN
  Administered 2018-03-29 (×3): 10 mg via ORAL
  Administered 2018-03-30: 5 mg via ORAL
  Administered 2018-03-30: 10 mg via ORAL
  Filled 2018-03-29 (×4): qty 2

## 2018-03-29 MED ORDER — METOCLOPRAMIDE HCL 5 MG PO TABS: 5.0000 mg | ORAL_TABLET | Freq: Three times a day (TID) | ORAL | Status: DC | PRN

## 2018-03-29 MED ORDER — LIDOCAINE HCL (CARDIAC) PF 100 MG/5ML IV SOSY
PREFILLED_SYRINGE | INTRAVENOUS | Status: DC | PRN
  Administered 2018-03-29: 50 mg via INTRATRACHEAL

## 2018-03-29 MED ORDER — ONDANSETRON HCL 4 MG/2ML IJ SOLN
INTRAMUSCULAR | Status: AC
  Filled 2018-03-29: qty 2

## 2018-03-29 MED ORDER — DEXAMETHASONE SODIUM PHOSPHATE 10 MG/ML IJ SOLN
INTRAMUSCULAR | Status: DC | PRN
  Administered 2018-03-29: 10 mg via INTRAVENOUS

## 2018-03-29 MED ORDER — OXYCODONE HCL 5 MG PO TABS
ORAL_TABLET | ORAL | Status: AC
  Filled 2018-03-29: qty 2

## 2018-03-29 MED ORDER — LACTATED RINGERS IV SOLN
INTRAVENOUS | Status: DC
  Administered 2018-03-29 (×2): via INTRAVENOUS

## 2018-03-29 MED ORDER — DOCUSATE SODIUM 100 MG PO CAPS
100.0000 mg | ORAL_CAPSULE | Freq: Two times a day (BID) | ORAL | Status: DC
  Administered 2018-03-29 – 2018-03-31 (×5): 100 mg via ORAL
  Filled 2018-03-29 (×5): qty 1

## 2018-03-29 MED ORDER — METHOCARBAMOL 500 MG PO TABS
500.0000 mg | ORAL_TABLET | Freq: Four times a day (QID) | ORAL | Status: DC | PRN
  Administered 2018-03-29: 500 mg via ORAL
  Filled 2018-03-29: qty 1

## 2018-03-29 MED ORDER — FENTANYL CITRATE (PF) 100 MCG/2ML IJ SOLN
INTRAMUSCULAR | Status: DC | PRN
  Administered 2018-03-29: 50 ug via INTRAVENOUS
  Administered 2018-03-29 (×2): 25 ug via INTRAVENOUS
  Administered 2018-03-29: 50 ug via INTRAVENOUS

## 2018-03-29 MED ORDER — MENTHOL 3 MG MT LOZG: 1.0000 | LOZENGE | OROMUCOSAL | Status: DC | PRN

## 2018-03-29 MED ORDER — PHENYLEPHRINE HCL 10 MG/ML IJ SOLN
INTRAMUSCULAR | Status: DC | PRN
  Administered 2018-03-29: 40 ug via INTRAVENOUS
  Administered 2018-03-29: 80 ug via INTRAVENOUS

## 2018-03-29 MED ORDER — POLYETHYLENE GLYCOL 3350 17 G PO PACK: 17.0000 g | PACK | Freq: Every day | ORAL | Status: DC | PRN

## 2018-03-29 MED ORDER — HYDROMORPHONE HCL 1 MG/ML IJ SOLN
0.5000 mg | INTRAMUSCULAR | Status: DC | PRN
  Administered 2018-03-29: 1 mg via INTRAVENOUS
  Filled 2018-03-29 (×2): qty 1

## 2018-03-29 MED ORDER — LIDOCAINE 2% (20 MG/ML) 5 ML SYRINGE
INTRAMUSCULAR | Status: AC
  Filled 2018-03-29: qty 5

## 2018-03-29 MED ORDER — 0.9 % SODIUM CHLORIDE (POUR BTL) OPTIME
TOPICAL | Status: DC | PRN
  Administered 2018-03-29: 1000 mL

## 2018-03-29 MED ORDER — GABAPENTIN 300 MG PO CAPS
300.0000 mg | ORAL_CAPSULE | Freq: Three times a day (TID) | ORAL | Status: DC
  Administered 2018-03-29 – 2018-03-30 (×3): 300 mg via ORAL
  Filled 2018-03-29 (×3): qty 1

## 2018-03-29 MED ORDER — CHLORHEXIDINE GLUCONATE 4 % EX LIQD: 60.0000 mL | Freq: Once | CUTANEOUS | Status: DC

## 2018-03-29 SURGICAL SUPPLY — 51 items
BAG DECANTER FOR FLEXI CONT (MISCELLANEOUS) ×2 IMPLANT
BLADE SAG 18X100X1.27 (BLADE) ×2 IMPLANT
CLSR STERI-STRIP ANTIMIC 1/2X4 (GAUZE/BANDAGES/DRESSINGS) ×2 IMPLANT
COVER PERINEAL POST (MISCELLANEOUS) ×2 IMPLANT
COVER SURGICAL LIGHT HANDLE (MISCELLANEOUS) ×2 IMPLANT
DRAPE C-ARM 42X72 X-RAY (DRAPES) ×2 IMPLANT
DRAPE STERI IOBAN 125X83 (DRAPES) ×2 IMPLANT
DRAPE U-SHAPE 47X51 STRL (DRAPES) IMPLANT
DRSG MEPILEX BORDER 4X8 (GAUZE/BANDAGES/DRESSINGS) ×2 IMPLANT
DURAPREP 26ML APPLICATOR (WOUND CARE) ×2 IMPLANT
ELECT BLADE 4.0 EZ CLEAN MEGAD (MISCELLANEOUS) ×2
ELECT REM PT RETURN 9FT ADLT (ELECTROSURGICAL) ×2
ELECTRODE BLDE 4.0 EZ CLN MEGD (MISCELLANEOUS) ×1 IMPLANT
ELECTRODE REM PT RTRN 9FT ADLT (ELECTROSURGICAL) ×1 IMPLANT
FACESHIELD WRAPAROUND (MASK) ×4 IMPLANT
GLOVE BIO SURGEON STRL SZ7.5 (GLOVE) ×4 IMPLANT
GLOVE BIOGEL PI IND STRL 8 (GLOVE) ×2 IMPLANT
GLOVE BIOGEL PI INDICATOR 8 (GLOVE) ×2
GOWN STRL REUS W/ TWL LRG LVL3 (GOWN DISPOSABLE) ×2 IMPLANT
GOWN STRL REUS W/TWL LRG LVL3 (GOWN DISPOSABLE) ×2
HEAD BIOLOX HIP 36/-5 (Joint) ×1 IMPLANT
HIP BIOLOX HD 36/-5 (Joint) ×2 IMPLANT
INSERT POLYETHYLENE 36M-0 (Insert) ×2 IMPLANT
KIT BASIN OR (CUSTOM PROCEDURE TRAY) ×2 IMPLANT
KIT TURNOVER KIT B (KITS) ×2 IMPLANT
MANIFOLD NEPTUNE II (INSTRUMENTS) ×2 IMPLANT
NDL SAFETY ECLIPSE 18X1.5 (NEEDLE) IMPLANT
NEEDLE HYPO 18GX1.5 SHARP (NEEDLE)
NEEDLE HYPO 22GX1.5 SAFETY (NEEDLE) ×2 IMPLANT
NS IRRIG 1000ML POUR BTL (IV SOLUTION) ×2 IMPLANT
PACK TOTAL JOINT (CUSTOM PROCEDURE TRAY) ×2 IMPLANT
PAD ARMBOARD 7.5X6 YLW CONV (MISCELLANEOUS) ×2 IMPLANT
SCREW HEX LP 6.5X20 (Screw) ×2 IMPLANT
SHELL TRIDENT II CLUST 50 (Shell) ×2 IMPLANT
STEM HIP 4 127DEG (Stem) ×2 IMPLANT
SUT MNCRL AB 4-0 PS2 18 (SUTURE) ×2 IMPLANT
SUT VIC AB 0 CT1 27 (SUTURE) ×1
SUT VIC AB 0 CT1 27XBRD ANBCTR (SUTURE) ×1 IMPLANT
SUT VIC AB 1 CT1 27 (SUTURE) ×1
SUT VIC AB 1 CT1 27XBRD ANBCTR (SUTURE) ×1 IMPLANT
SUT VIC AB 2-0 CT1 27 (SUTURE) ×1
SUT VIC AB 2-0 CT1 TAPERPNT 27 (SUTURE) ×1 IMPLANT
SUT VLOC 180 0 24IN GS25 (SUTURE) IMPLANT
SYR 50ML LL SCALE MARK (SYRINGE) ×2 IMPLANT
SYR BULB IRRIGATION 50ML (SYRINGE) ×2 IMPLANT
SYRINGE 20CC LL (MISCELLANEOUS) IMPLANT
TOWEL OR 17X24 6PK STRL BLUE (TOWEL DISPOSABLE) ×2 IMPLANT
TOWEL OR 17X26 10 PK STRL BLUE (TOWEL DISPOSABLE) ×2 IMPLANT
TRAY CATH 16FR W/PLASTIC CATH (SET/KITS/TRAYS/PACK) ×2 IMPLANT
WATER STERILE IRR 1000ML POUR (IV SOLUTION) ×2 IMPLANT
YANKAUER SUCT BULB TIP NO VENT (SUCTIONS) ×4 IMPLANT

## 2018-03-29 NOTE — Anesthesia Procedure Notes (Signed)
Spinal  Patient location during procedure: OR Start time: 03/29/2018 7:32 AM End time: 03/29/2018 7:42 AM Staffing Anesthesiologist: Freddrick March, MD Performed: anesthesiologist  Preanesthetic Checklist Completed: patient identified, surgical consent, pre-op evaluation, timeout performed, IV checked, risks and benefits discussed and monitors and equipment checked Spinal Block Patient position: sitting Prep: DuraPrep Patient monitoring: heart rate, continuous pulse ox and blood pressure Approach: midline Location: L3-4 Injection technique: single-shot Needle Needle type: Pencan  Needle gauge: 24 G Needle length: 9 cm Assessment Sensory level: T6 Additional Notes Discussed risks and benefits of and difference between general anesthesia and spinal anesthesia. Discussed risks of spinal including headache, backache, failure, bleeding, infection, and nerve damage. Patient consents to spinal. Questions answered. Coagulation studies and platelet count acceptable.

## 2018-03-29 NOTE — Transfer of Care (Signed)
Immediate Anesthesia Transfer of Care Note  Patient: Gloria Reeves  Procedure(s) Performed: LEFT TOTAL HIP ARTHROPLASTY ANTERIOR APPROACH (Left Hip)  Patient Location: PACU  Anesthesia Type:MAC and Spinal  Level of Consciousness: awake, alert , oriented and sedated  Airway & Oxygen Therapy: Patient Spontanous Breathing and Patient connected to face mask oxygen  Post-op Assessment: Report given to RN, Post -op Vital signs reviewed and stable and Patient moving all extremities  Post vital signs: Reviewed and stable  Last Vitals:  Vitals Value Taken Time  BP 119/70 03/29/2018  9:48 AM  Temp    Pulse 93 03/29/2018  9:51 AM  Resp 22 03/29/2018  9:51 AM  SpO2 100 % 03/29/2018  9:51 AM  Vitals shown include unvalidated device data.  Last Pain:  Vitals:   03/29/18 0609  TempSrc: Oral  PainSc:          Complications: No apparent anesthesia complications

## 2018-03-29 NOTE — Plan of Care (Signed)
  Problem: Pain Managment: Goal: General experience of comfort will improve Outcome: Progressing   Problem: Safety: Goal: Ability to remain free from injury will improve Outcome: Progressing   

## 2018-03-29 NOTE — Anesthesia Procedure Notes (Signed)
Procedure Name: MAC Date/Time: 03/29/2018 7:45 AM Performed by: Scheryl Darter, CRNA Pre-anesthesia Checklist: Patient identified, Emergency Drugs available, Suction available and Patient being monitored Patient Re-evaluated:Patient Re-evaluated prior to induction Oxygen Delivery Method: Simple face mask Placement Confirmation: positive ETCO2

## 2018-03-29 NOTE — Evaluation (Signed)
Physical Therapy Evaluation Patient Details Name: Gloria Reeves MRN: [id number] DOB: [birth year] Today's Date: 03/29/2018   History of Present Illness  Gloria Reeves is a 47 y.o F s/p L THR Direct Anterior. PMH includes Arthritis, OA, MS, Esophageal Reflex, Avascular Necrosis, GERD, and R THR.   Clinical Impression  Pt presents with problems above and deficits below. Pt performed supine HEP. Pt performed bed mobility with MinA, and performed sit<>stand transfers with Min-ModA and RW. Pt ambulated a short distance with RW and MinA for steadying. Pt limited by pain and fatigue. Will continue to follow acutely to support independence, mobility, and safety.    Follow Up Recommendations Follow surgeon's recommendation for DC plan and follow-up therapies;Supervision/Assistance - 24 hour    Equipment Recommendations  Rolling walker with 5" wheels;3in1 (PT)    Recommendations for Other Services OT consult     Precautions / Restrictions Precautions Precautions: Fall Restrictions Weight Bearing Restrictions: Yes LLE Weight Bearing: Weight bearing as tolerated      Mobility  Bed Mobility Overal bed mobility: Needs Assistance Bed Mobility: Supine to Sit     Supine to sit: Min assist     General bed mobility comments: Pt required MinA for trunk elevation.   Transfers Overall transfer level: Needs assistance Equipment used: Rolling walker (2 wheeled) Transfers: Sit to/from Stand Sit to Stand: Min assist;Mod assist         General transfer comment: Pt required Min-ModA with RW to stand, and pulled up on PT's arm for support. Pt reported increased pain with transfer and was unsteady even with Bilateral UE support.   Ambulation/Gait Ambulation/Gait assistance: Min assist Gait Distance (Feet): 6 Feet Assistive device: Rolling walker (2 wheeled) Gait Pattern/deviations: Step-to pattern;Decreased weight shift to left;Decreased step length - right;Decreased stance time -  left;Decreased stride length;Drifts right/left Gait velocity: Decreased   General Gait Details: Pt required MinA for steadying during ambulation with RW. Pt drifted left with cues to walk forward, and L leg appeared to buckle 50% of the time. Pt heavily reliant on RW for balance. Pt's step were small and had decreased weight shift to the L. Pt reported fatigued during ambulation and requested to sit down.    Stairs            Wheelchair Mobility    Modified Rankin (Stroke Patients Only)       Balance Overall balance assessment: Needs assistance Sitting-balance support: No upper extremity supported;Feet supported Sitting balance-Leahy Scale: Fair Sitting balance - Comments: Pt was able to sit without UE support while PT put on gait belt, but had some unsteadiness.    Standing balance support: Bilateral upper extremity supported Standing balance-Leahy Scale: Poor Standing balance comment: Pt reliant on BUE support for balance during static standing. Exhibited anterior-posterior sway.                              Pertinent Vitals/Pain Pain Assessment: 0-10 Pain Score: 8  Pain Location: L Hip Pain Descriptors / Indicators: Grimacing;Guarding;Moaning Pain Intervention(s): Limited activity within patient's tolerance;Monitored during session;Repositioned    Home Living Family/patient expects to be discharged to:: Private residence Living Arrangements: Alone Available Help at Discharge: Family;Available 24 hours/day(Parents will be staying with her for two weeks) Type of Home: Apartment Home Access: Stairs to enter Entrance Stairs-Rails: Right Entrance Stairs-Number of Steps: 25 Home Layout: One level Home Equipment: Toilet riser;Cane - single point      Prior Function Level of  Independence: Independent;Independent with assistive device(s)         Comments: Pt reports she requires increased time for getting dressed, but was able to work, and get around. Pt  reports occasionally using a cane.      Hand Dominance   Dominant Hand: Right    Extremity/Trunk Assessment   Upper Extremity Assessment Upper Extremity Assessment: Defer to OT evaluation    Lower Extremity Assessment Lower Extremity Assessment: LLE deficits/detail;RLE deficits/detail RLE Deficits / Details: Pt reports some light numbness RLE Sensation: decreased light touch LLE Deficits / Details: Pt presents with pain and deficits as expected s/p L THR. Pt reports some numbness LLE Sensation: decreased light touch    Cervical / Trunk Assessment Cervical / Trunk Assessment: Normal  Communication   Communication: No difficulties  Cognition Arousal/Alertness: Awake/alert Behavior During Therapy: WFL for tasks assessed/performed Overall Cognitive Status: Within Functional Limits for tasks assessed                                 General Comments: Pt was sleepy, likely secondary to pain medication, but was able to answer questions clearly and responded to questions and cues.       General Comments      Exercises Total Joint Exercises Ankle Circles/Pumps: AROM;20 reps;Supine;Both Quad Sets: AROM;Left;10 reps;Supine Heel Slides: AAROM;Left;10 reps;Supine;Limitations Heel Slides Limitations: Pt heavily guarded, and limited in available ROM.    Assessment/Plan    PT Assessment Patient needs continued PT services  PT Problem List Decreased strength;Decreased range of motion;Decreased activity tolerance;Decreased balance;Decreased mobility;Decreased knowledge of use of DME;Pain;Impaired sensation       PT Treatment Interventions DME instruction;Gait training;Stair training;Functional mobility training;Therapeutic activities;Balance training;Therapeutic exercise;Patient/family education    PT Goals (Current goals can be found in the Care Plan section)  Acute Rehab PT Goals Patient Stated Goal: To walk normally PT Goal Formulation: With patient Time For Goal  Achievement: 04/12/18 Potential to Achieve Goals: Good    Frequency 7X/week   Barriers to discharge Inaccessible home environment      Co-evaluation               AM-PAC PT "6 Clicks" Daily Activity  Outcome Measure Difficulty turning over in bed (including adjusting bedclothes, sheets and blankets)?: Unable Difficulty moving from lying on back to sitting on the side of the bed? : Unable Difficulty sitting down on and standing up from a chair with arms (e.g., wheelchair, bedside commode, etc,.)?: Unable Help needed moving to and from a bed to chair (including a wheelchair)?: A Little Help needed walking in hospital room?: A Little Help needed climbing 3-5 steps with a railing? : A Lot 6 Click Score: 11    End of Session Equipment Utilized During Treatment: Gait belt Activity Tolerance: Patient limited by pain;Patient limited by fatigue Patient left: in chair;with call bell/phone within reach;with family/visitor present Nurse Communication: Mobility status PT Visit Diagnosis: Unsteadiness on feet (R26.81);Other abnormalities of gait and mobility (R26.89);Muscle weakness (generalized) (M62.81);Difficulty in walking, not elsewhere classified (R26.2);Pain;Other symptoms and signs involving the nervous system (R29.898) Pain - Right/Left: Left Pain - part of body: Hip    Time: 7793-9030 PT Time Calculation (min) (ACUTE ONLY): 30 min   Charges:   PT Evaluation $PT Eval Low Complexity: 1 Low PT Treatments $Therapeutic Activity: 8-22 mins      Elwin Mocha, S-DPT [facility] [phone]   03/29/2018, 6:07 PM

## 2018-03-29 NOTE — Op Note (Signed)
03/29/2018  9:15 AM  PATIENT:  Gloria Reeves   MRN: [id number]  PRE-OPERATIVE DIAGNOSIS:  OA LEFT HIP  POST-OPERATIVE DIAGNOSIS:  OA LEFT HIP  PROCEDURE:  Procedure(s): LEFT TOTAL HIP ARTHROPLASTY ANTERIOR APPROACH  PREOPERATIVE INDICATIONS:    [name] is an 47 y.o. female who has a diagnosis of Primary osteoarthritis of left hip and elected for surgical management after failing conservative treatment.  The risks benefits and alternatives were discussed with the patient including but not limited to the risks of nonoperative treatment, versus surgical intervention including infection, bleeding, nerve injury, periprosthetic fracture, the need for revision surgery, dislocation, leg length discrepancy, blood clots, cardiopulmonary complications, morbidity, mortality, among others, and they were willing to proceed.     OPERATIVE REPORT     SURGEON:   Renette Butters, MD    ASSISTANT:  Roxan Hockey, PA-C, he was present and scrubbed throughout the case, critical for completion in a timely fashion, and for retraction, instrumentation, and closure.     ANESTHESIA:  General    COMPLICATIONS:  None.     COMPONENTS:  [employer]ker acolade fit femur size 4 with a 36 mm -5 head ball and a PSL acetabular shell size 50 with a  polyethylene liner    PROCEDURE IN DETAIL:   The patient was met in the holding area and  identified.  The appropriate hip was identified and marked at the operative site.  The patient was then transported to the OR  and  placed under anesthesia per that record.  At that point, the patient was  placed in the supine position and  secured to the operating room table and all bony prominences padded. He received pre-operative antibiotics    The operative lower extremity was prepped from the iliac crest to the distal leg.  Sterile draping was performed.  Time out was performed prior to incision.      Skin incision was made just 2 cm lateral to the ASIS  extending  in line with the tensor fascia lata. Electrocautery was used to control all bleeders. I dissected down sharply to the fascia of the tensor fascia lata was confirmed that the muscle fibers beneath were running posteriorly. I then incised the fascia over the superficial tensor fascia lata in line with the incision. The fascia was elevated off the anterior aspect of the muscle the muscle was retracted posteriorly and protected throughout the case. I then used electrocautery to incise the tensor fascia lata fascia control and all bleeders. Immediately visible was the fat over top of the anterior neck and capsule.  I removed the anterior fat from the capsule and elevated the rectus muscle off of the anterior capsule. I then removed a large time of capsule. The retractors were then placed over the anterior acetabulum as well as around the superior and inferior neck.  I then removed a section of the femoral neck and a napkin ring fashion. Then used the power course to remove the femoral head from the acetabulum and thoroughly irrigated the acetabulum. I sized the femoral head.    I then exposed the deep acetabulum, cleared out any tissue including the ligamentum teres.   After adequate visualization, I excised the labrum, and then sequentially reamed.  I then impacted the acetabular implant into place using fluoroscopy for guidance.  Appropriate version and inclination was confirmed clinically matching their bony anatomy, and with fluoroscopy.  I placed a 20 mm screw in the posterior/superio position with an excellent bite.  I then placed the polyethylene liner in place  I then adducted the leg and released the external rotators from the posterior femur allowing it to be easily delivered up lateral and anterior to the acetabulum for preparation of the femoral canal.    I then prepared the proximal femur using the cookie-cutter and then sequentially reamed and broached.  A trial broach, neck, and head was  utilized, and I reduced the hip and used floroscopy to assess the neck length and femoral implant.  I then impacted the femoral prosthesis into place into the appropriate version. The hip was then reduced and fluoroscopy confirmed appropriate position. Leg lengths were restored.  I then irrigated the hip copiously again with, and repaired the fascia with Vicryl, followed by monocryl for the subcutaneous tissue, Monocryl for the skin, Steri-Strips and sterile gauze. The patient was then awakened and returned to PACU in stable and satisfactory condition. There were no complications.  POST OPERATIVE PLAN: WBAT, DVT px: SCD's/TED, ambulation and chemical dvt px  Timothy Murphy, MD Orthopedic Surgeon [phone]     

## 2018-03-29 NOTE — Anesthesia Postprocedure Evaluation (Signed)
Anesthesia Post Note  Patient: Raynah N Hardin Negus  Procedure(s) Performed: LEFT TOTAL HIP ARTHROPLASTY ANTERIOR APPROACH (Left Hip)     Patient location during evaluation: PACU Anesthesia Type: Spinal Level of consciousness: oriented and awake and alert Pain management: pain level controlled Vital Signs Assessment: post-procedure vital signs reviewed and stable Respiratory status: spontaneous breathing, respiratory function stable and patient connected to nasal cannula oxygen Cardiovascular status: blood pressure returned to baseline and stable Postop Assessment: no headache, no backache and no apparent nausea or vomiting Anesthetic complications: no    Last Vitals:  Vitals:   03/29/18 1104 03/29/18 1200  BP: 117/83 114/76  Pulse: 87 94  Resp: (!) 22 20  Temp:  (!) 36.3 C  SpO2: 100% 96%    Last Pain:  Vitals:   03/29/18 1200  TempSrc: Oral  PainSc:                  Jarrah Babich L Zipporah Finamore

## 2018-03-29 NOTE — Interval H&P Note (Signed)
History and Physical Interval Note:  03/29/2018 7:18 AM  Gloria Reeves  has presented today for surgery, with the diagnosis of OA LEFT HIP  The various methods of treatment have been discussed with the patient and family. After consideration of risks, benefits and other options for treatment, the patient has consented to  Procedure(s): LEFT TOTAL HIP ARTHROPLASTY ANTERIOR APPROACH (Left) as a surgical intervention .  The patient's history has been reviewed, patient examined, no change in status, stable for surgery.  I have reviewed the patient's chart and labs.  Questions were answered to the patient's satisfaction.     Clydette Privitera D

## 2018-03-29 NOTE — Plan of Care (Signed)

## 2018-03-29 NOTE — Discharge Instructions (Signed)
You may bear weight as tolerated. °Keep your dressing on and dry until follow up. °Take medicine to prevent blood clots as directed. °Take pain medicine as needed with the goal of transitioning to over the counter medicines.  °If needed, you may increase pain medication for the first few days post up to 2 tablets every 4 hours. ° °INSTRUCTIONS AFTER JOINT REPLACEMENT  ° °o Remove items at home which could result in a fall. This includes throw rugs or furniture in walking pathways °o ICE to the affected joint every three hours while awake for 30 minutes at a time, for at least the first 3-5 days, and then as needed for pain and swelling.  Continue to use ice for pain and swelling. You may notice swelling that will progress down to the foot and ankle.  This is normal after surgery.  Elevate your leg when you are not up walking on it.   °o Continue to use the breathing machine you got in the hospital (incentive spirometer) which will help keep your temperature down.  It is common for your temperature to cycle up and down following surgery, especially at night when you are not up moving around and exerting yourself.  The breathing machine keeps your lungs expanded and your temperature down. ° ° °DIET:  As you were doing prior to hospitalization, we recommend a well-balanced diet. ° °DRESSING / WOUND CARE / SHOWERING ° °You may shower 3 days after surgery, but keep the wounds dry during showering.  You may use an occlusive plastic wrap (Press'n Seal for example) with blue painter's tape at edges, NO SOAKING/SUBMERGING IN THE BATHTUB.  If the bandage gets wet, change with a clean dry gauze.  If the incision gets wet, pat the wound dry with a clean towel. ° °ACTIVITY ° °o Increase activity slowly as tolerated, but follow the weight bearing instructions below.   °o No driving for 6 weeks or until further direction given by your physician.  You cannot drive while taking narcotics.  °o No lifting or carrying greater than 10  lbs. until further directed by your surgeon. °o Avoid periods of inactivity such as sitting longer than an hour when not asleep. This helps prevent blood clots.  °o You may return to work once you are authorized by your doctor.  ° ° ° °WEIGHT BEARING  ° °Weight bearing as tolerated with assist device (walker, cane, etc) as directed, use it as long as suggested by your surgeon or therapist, typically at least 4-6 weeks. ° ° °EXERCISES ° °Results after joint replacement surgery are often greatly improved when you follow the exercise, range of motion and muscle strengthening exercises prescribed by your doctor. Safety measures are also important to protect the joint from further injury. Any time any of these exercises cause you to have increased pain or swelling, decrease what you are doing until you are comfortable again and then slowly increase them. If you have problems or questions, call your caregiver or physical therapist for advice.  ° °Rehabilitation is important following a joint replacement. After just a few days of immobilization, the muscles of the leg can become weakened and shrink (atrophy).  These exercises are designed to build up the tone and strength of the thigh and leg muscles and to improve motion. Often times heat used for twenty to thirty minutes before working out will loosen up your tissues and help with improving the range of motion but do not use heat for the first two   weeks following surgery (sometimes heat can increase post-operative swelling).  ° °These exercises can be done on a training (exercise) mat, on the floor, on a table or on a bed. Use whatever works the best and is most comfortable for you.    Use music or television while you are exercising so that the exercises are a pleasant break in your day. This will make your life better with the exercises acting as a break in your routine that you can look forward to.   Perform all exercises about fifteen times, three times per day or as  directed.  You should exercise both the operative leg and the other leg as well. ° °Exercises include: °  °• Quad Sets - Tighten up the muscle on the front of the thigh (Quad) and hold for 5-10 seconds.   °• Straight Leg Raises - With your knee straight (if you were given a brace, keep it on), lift the leg to 60 degrees, hold for 3 seconds, and slowly lower the leg.  Perform this exercise against resistance later as your leg gets stronger.  °• Leg Slides: Lying on your back, slowly slide your foot toward your buttocks, bending your knee up off the floor (only go as far as is comfortable). Then slowly slide your foot back down until your leg is flat on the floor again.  °• Angel Wings: Lying on your back spread your legs to the side as far apart as you can without causing discomfort.  °• Hamstring Strength:  Lying on your back, push your heel against the floor with your leg straight by tightening up the muscles of your buttocks.  Repeat, but this time bend your knee to a comfortable angle, and push your heel against the floor.  You may put a pillow under the heel to make it more comfortable if necessary.  ° °A rehabilitation program following joint replacement surgery can speed recovery and prevent re-injury in the future due to weakened muscles. Contact your doctor or a physical therapist for more information on knee rehabilitation.  ° ° °CONSTIPATION ° °Constipation is defined medically as fewer than three stools per week and severe constipation as less than one stool per week.  Even if you have a regular bowel pattern at home, your normal regimen is likely to be disrupted due to multiple reasons following surgery.  Combination of anesthesia, postoperative narcotics, change in appetite and fluid intake all can affect your bowels.  ° °YOU MUST use at least one of the following options; they are listed in order of increasing strength to get the job done.  They are all available over the counter, and you may need to  use some, POSSIBLY even all of these options:   ° °Drink plenty of fluids (prune juice may be helpful) and high fiber foods °Colace 100 mg by mouth twice a day  °Senokot for constipation as directed and as needed Dulcolax (bisacodyl), take with full glass of water  °Miralax (polyethylene glycol) once or twice a day as needed. ° °If you have tried all these things and are unable to have a bowel movement in the first 3-4 days after surgery call either your surgeon or your primary doctor.   ° °If you experience loose stools or diarrhea, hold the medications until you stool forms back up.  If your symptoms do not get better within 1 week or if they get worse, check with your doctor.  If you experience "the worst abdominal pain ever" or   develop nausea or vomiting, please contact the office immediately for further recommendations for treatment. ° ° °ITCHING:  If you experience itching with your medications, try taking only a single pain pill, or even half a pain pill at a time.  You can also use Benadryl over the counter for itching or also to help with sleep.  ° °TED HOSE STOCKINGS:  Use stockings on both legs until for at least 2 weeks or as directed by physician office. They may be removed at night for sleeping. ° °MEDICATIONS:  See your medication summary on the “After Visit Summary” that nursing will review with you.  You may have some home medications which will be placed on hold until you complete the course of blood thinner medication.  It is important for you to complete the blood thinner medication as prescribed. ° °PRECAUTIONS:  If you experience chest pain or shortness of breath - call 911 immediately for transfer to the hospital emergency department.  ° °If you develop a fever greater that 101 F, purulent drainage from wound, increased redness or drainage from wound, foul odor from the wound/dressing, or calf pain - CONTACT YOUR SURGEON.   °                                                °FOLLOW-UP  APPOINTMENTS:  If you do not already have a post-op appointment, please call the office for an appointment to be seen by your surgeon.  Guidelines for how soon to be seen are listed in your “After Visit Summary”, but are typically between 1-4 weeks after surgery. ° °OTHER INSTRUCTIONS:  ° ° ° °MAKE SURE YOU:  °• Understand these instructions.  °• Get help right away if you are not doing well or get worse.  ° ° °Thank you for letting us be a part of your medical care team.  It is a privilege we respect greatly.  We hope these instructions will help you stay on track for a fast and full recovery!  ° ° ° ° °

## 2018-03-30 ENCOUNTER — Encounter (HOSPITAL_COMMUNITY): Payer: Self-pay | Admitting: Orthopedic Surgery

## 2018-03-30 LAB — BASIC METABOLIC PANEL
Anion gap: 11 (ref 5–15)
BUN: 6 mg/dL (ref 6–20)
CALCIUM: 9.2 mg/dL (ref 8.9–10.3)
CO2: 24 mmol/L (ref 22–32)
Chloride: 103 mmol/L (ref 98–111)
Creatinine, Ser: 0.89 mg/dL (ref 0.44–1.00)
GFR calc Af Amer: >60 mL/min (ref >60)
GLUCOSE: 172 mg/dL — AB (ref 70–99)
POTASSIUM: 4.2 mmol/L (ref 3.5–5.1)
SODIUM: 138 mmol/L (ref 135–145)

## 2018-03-30 LAB — CBC
HCT: 39.3 % (ref 36.0–46.0)
Hemoglobin: 12.7 g/dL (ref 12.0–15.0)
MCH: 29.1 pg (ref 26.0–34.0)
MCHC: 32.3 g/dL (ref 30.0–36.0)
MCV: 90.1 fL (ref 78.0–100.0)
PLATELETS: 273 10*3/uL (ref 150–400)
RBC: 4.36 MIL/uL (ref 3.87–5.11)
RDW: 14.6 % (ref 11.5–15.5)
WBC: 11.2 10*3/uL — AB (ref 4.0–10.5)

## 2018-03-30 MED ORDER — LABETALOL HCL 5 MG/ML IV SOLN
20.0000 mg | INTRAVENOUS | Status: DC | PRN
  Filled 2018-03-30: qty 4

## 2018-03-30 MED ORDER — GABAPENTIN 300 MG PO CAPS
300.0000 mg | ORAL_CAPSULE | Freq: Two times a day (BID) | ORAL | Status: DC
  Administered 2018-03-30 – 2018-03-31 (×2): 300 mg via ORAL
  Filled 2018-03-30 (×2): qty 1

## 2018-03-30 NOTE — Progress Notes (Signed)
Spoke with PA, will continue to monitor patient over night. CBC, BMP will be taken tomorrow morning. Continue to monitor BP, HR, LOC, and dizziness. If any changes please contact doctor.

## 2018-03-30 NOTE — Progress Notes (Signed)
    Subjective: Patient feeling well.  Patient reports pain as moderate.  Tolerating diet.  Urinating.  No CP, SOB.  Early mobilization with therapy.  Objective:   VITALS:   Vitals:   03/29/18 2147 03/29/18 2148 03/30/18 0056 03/30/18 0519  BP:   129/88 (!) 146/103  Pulse:   79 85  Resp:   16 16  Temp:   98 F (36.7 C) 98.5 F (36.9 C)  TempSrc: Oral  Oral Oral  SpO2:   96% 98%  Weight:  96.9 kg    Height:       CBC Latest Ref Rng & Units 03/17/2018  WBC 4.0 - 10.5 K/uL 9.5  Hemoglobin 12.0 - 15.0 g/dL 13.5  Hematocrit 36.0 - 46.0 % 41.8  Platelets 150 - 400 K/uL 337   BMP Latest Ref Rng & Units 03/17/2018  Glucose 70 - 99 mg/dL 102(H)  BUN 6 - 20 mg/dL 9  Creatinine 0.44 - 1.00 mg/dL 0.79  Sodium 135 - 145 mmol/L 141  Potassium 3.5 - 5.1 mmol/L 4.1  Chloride 98 - 111 mmol/L 108  CO2 22 - 32 mmol/L 22  Calcium 8.9 - 10.3 mg/dL 9.1   Intake/Output      08/13 0701 - 08/14 0700 08/14 0701 - 08/15 0700   P.O. 560    I.V. (mL/kg) 1100 (11.4)    Total Intake(mL/kg) 1660 (17.1)    Urine (mL/kg/hr) 150 (0.1)    Blood 100    Total Output 250    Net +1410         Urine Occurrence 2 x       Physical Exam: General: NAD.  Upright in bed on arrival.  Calm, Conversant.  Father at bedside. Resp: No increased wob Cardio: regular rate and rhythm ABD soft Neurologically intact MSK Neurovascularly intact Sensation intact distally Intact pulses distally Dorsiflexion/Plantar flexion intact Incision: dressing C/D/I   Assessment: 1 Day Post-Op  S/P Procedure(s) (LRB): LEFT TOTAL HIP ARTHROPLASTY ANTERIOR APPROACH (Left) by Dr. Ernesta Amble. Percell Miller on 01/27/2017  Principal Problem:   Primary osteoarthritis of left hip Active Problems:   Multiple sclerosis, relapsing-remitting (HCC)   Avascular necrosis of bone of hip, left (HCC)   Esophageal reflux   Primary osteoarthritis of hip   Primary osteoarthritis, status post left total hip arthroplasty Doing well postop day  1 Eating, drinking, and voiding Pain controlled Early mobilization with therapy BP up this morning, but measured and significant pain which has improved.  Plan: Up with therapy D/C IV fluids Incentive Spirometry Apply ice   Weight Bearing: Weight Bearing as Tolerated (WBAT) LLE Dressings: Maintain Mepilex.   VTE prophylaxis: Aspirin, SCDs, ambulation Dispo: Home after second therapy session today.   Gloria Elizabeth Martensen III, PA-C 03/30/2018, 7:30 AM

## 2018-03-30 NOTE — Plan of Care (Signed)
  Problem: Pain Managment: Goal: General experience of comfort will improve Outcome: Progressing   Problem: Education: Goal: Knowledge of General Education information will improve Description Including pain rating scale, medication(s)/side effects and non-pharmacologic comfort measures Outcome: Progressing   Problem: Skin Integrity: Goal: Risk for impaired skin integrity will decrease Outcome: Progressing   Problem: Activity: Goal: Risk for activity intolerance will decrease Outcome: Progressing

## 2018-03-30 NOTE — Discharge Summary (Addendum)
Discharge Summary  Patient ID: GIRLIE VELTRI MRN: [id number] DOB/AGE: 45-May-1972 45 y.o.  Admit date: 03/29/2018 Discharge date: 03/30/2018  Admission Diagnoses:  Primary osteoarthritis of left hip  Discharge Diagnoses:  Principal Problem:   Primary osteoarthritis of left hip Active Problems:   Multiple sclerosis, relapsing-remitting (HCC)   Avascular necrosis of bone of hip, left (HCC)   Esophageal reflux   Primary osteoarthritis of hip   Past Medical History:  Diagnosis Date  . Alcohol abuse   . Anorexia   . Asthma    AS CHILD  . Cancer [facility])    SKIN CANCER  . Dysrhythmia    HAD INCREASED HR FOR AWHILE NOW.   ST 01/2018 EKG  . Esophageal reflux   . Migraine   . Mood disorder ([place])   . Multiple sclerosis, relapsing-remitting ([place]) 03/19/2016  . 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 Panic disorder   . Seasonal allergies   . Thyroid nodule     Surgeries: Procedure(s): LEFT TOTAL HIP ARTHROPLASTY ANTERIOR APPROACH on 03/29/2018   Consultants (if any):   Discharged Condition: Improved  Hospital Course: ELBERTA LACHAPELLE is an 47 y.o. female who was admitted 03/29/2018 with a diagnosis of Primary osteoarthritis of left hip and went to the operating room on 03/29/2018 and underwent the above named procedures.  She had some difficulty mobilization with therapy postop day 1 and remained in the hospital until POD 2 to work on safe mobilization.  She was given perioperative antibiotics:  Anti-infectives (From admission, onward)   Start     Dose/Rate Route Frequency Ordered Stop   03/29/18 1400  ceFAZolin (ANCEF) IVPB 1 g/50 mL premix     1 g 100 mL/hr over 30 Minutes Intravenous Every 6 hours 03/29/18 1153 03/29/18 2127   03/29/18 0600  ceFAZolin (ANCEF) IVPB 2g/100 mL premix     2 g 200 mL/hr over 30 Minutes Intravenous On call to O.R. 03/29/18 0543 03/29/18 0745    .  She was given sequential compression devices, early ambulation, and Aspirin for DVT  prophylaxis.  She benefited maximally from the hospital stay and there were no complications.    Recent vital signs:  Vitals:   03/30/18 0056 03/30/18 0519  BP: 129/88 (!) 146/103  Pulse: 79 85  Resp: 16 16  Temp: 98 F (36.7 C) 98.5 F (36.9 C)  SpO2: 96% 98%    Recent laboratory studies:  Lab Results  Component Value Date   HGB 13.5 03/17/2018   Lab Results  Component Value Date   WBC 9.5 03/17/2018   PLT 337 03/17/2018   No results found for: INR Lab Results  Component Value Date   NA 141 03/17/2018   K 4.1 03/17/2018   CL 108 03/17/2018   CO2 22 03/17/2018   BUN 9 03/17/2018   CREATININE 0.79 03/17/2018   GLUCOSE 102 (H) 03/17/2018    Discharge Medications:   Allergies as of 03/30/2018      Reactions   Keppra [levetiracetam]    Unknown   Sulfa Antibiotics    Unknown      Medication List    STOP taking these medications   meloxicam 15 MG tablet Commonly known as:  MOBIC     TAKE these medications   acetaminophen 500 MG tablet Commonly known as:  TYLENOL Take 2 tablets (1,000 mg total) by mouth every 8 (eight) hours for 14 days. For Pain. What changed:    medication strength  how much to take  when to take this  reasons to take this  additional instructions   aspirin EC 81 MG tablet Take 1 tablet (81 mg total) by mouth 2 (two) times daily. For DVT prophylaxis   celecoxib 200 MG capsule Commonly known as:  CELEBREX Take 1 capsule (200 mg total) by mouth 2 (two) times daily for 14 days. For 2 weeks post op for pain and inflammation.  Discontinue Ibuprofen or other Anti-inflammatory medicine when taking this medicine.   DEPO-PROVERA 150 MG/ML injection Generic drug:  medroxyPROGESTERone Inject 150 mg into the muscle every 3 (three) months.   docusate sodium 100 MG capsule Commonly known as:  COLACE Take 1 capsule (100 mg total) by mouth 2 (two) times daily. To prevent constipation while taking pain medication.   folic acid 1 MG  tablet Commonly known as:  FOLVITE Take 1 mg by mouth See admin instructions. Take 1mg  by mouth daily except on methotrexate day   gabapentin 300 MG capsule Commonly known as:  NEURONTIN Take 1 capsule (300 mg total) by mouth 2 (two) times daily for 14 days. For 2 weeks post op for pain. What changed:  additional instructions   methocarbamol 500 MG tablet Commonly known as:  ROBAXIN Take 1 tablet (500 mg total) by mouth every 6 (six) hours as needed for muscle spasms.   methotrexate 2.5 MG tablet Commonly known as:  RHEUMATREX Take 15 mg by mouth once a week.   multivitamin tablet Take 1 tablet by mouth daily.   omeprazole 20 MG capsule Commonly known as:  PRILOSEC Take 20 mg by mouth daily.   ondansetron 4 MG tablet Commonly known as:  ZOFRAN Take 1 tablet (4 mg total) by mouth every 8 (eight) hours as needed for nausea or vomiting.   oxyCODONE 5 MG immediate release tablet Commonly known as:  Oxy IR/ROXICODONE Take 1 tablet (5 mg total) by mouth every 4 (four) hours as needed for breakthrough pain.   Vitamin D (Ergocalciferol) 50000 units Caps capsule Commonly known as:  DRISDOL Take 1 capsule (50,000 Units total) by mouth every 7 (seven) days.       Diagnostic Studies: Dg C-arm 1-60 Min  Result Date: 03/29/2018 CLINICAL DATA:  Left total hip arthroplasty. EXAM: DG C-ARM 61-120 MIN; OPERATIVE LEFT HIP WITH PELVIS COMPARISON:  None. FLUOROSCOPY TIME:  14 seconds FINDINGS: Interval left total hip arthroplasty without failure or complication. No hip fracture or dislocation. Prior right total hip arthroplasty. IMPRESSION: Interval left total hip arthroplasty without failure or complication. Electronically Signed   By: Kathreen Devoid   On: 03/29/2018 09:29   Dg Hip Operative Unilat W Or W/o Pelvis Left  Result Date: 03/29/2018 CLINICAL DATA:  Left total hip arthroplasty. EXAM: DG C-ARM 61-120 MIN; OPERATIVE LEFT HIP WITH PELVIS COMPARISON:  None. FLUOROSCOPY TIME:  14 seconds  FINDINGS: Interval left total hip arthroplasty without failure or complication. No hip fracture or dislocation. Prior right total hip arthroplasty. IMPRESSION: Interval left total hip arthroplasty without failure or complication. Electronically Signed   By: Kathreen Devoid   On: 03/29/2018 09:29    Disposition: Discharge disposition: 01-Home or Self Care       Discharge Instructions    Discharge patient   Complete by:  As directed    After therapy sessions.   Discharge disposition:  01-Home or Self Care   Discharge patient date:  03/30/2018      Follow-up Information    Renette Butters, MD Follow up.  Specialty:  Orthopedic Surgery Contact information: [address] [place] [address] [phone]            Signed: Prudencio Burly III PA-C 03/30/2018, 7:36 AM

## 2018-03-30 NOTE — Evaluation (Addendum)
Occupational Therapy Evaluation and Discharge Patient Details Name: Gloria Reeves MRN: [id number] DOB: 02/16/1971 Today's Date: 03/30/2018    History of Present Illness Gloria Reeves is a 47 y.o F s/p L THR Direct Anterior. PMH includes Arthritis, OA, MS, Esophageal Reflex, Avascular Necrosis, GERD, and R THR.    Clinical Impression   PTA, pt was independent with ADL and functional mobility and working. Pt currently limited by L hip pain but very motivated to participate in daily self-care tasks independently. She currently is able to complete UB ADL independently, LB ADL with modified independence, and toilet transfers with modified independence. Pt and mother educated concerning compensatory strategies for ADL participation post-operatively, fall prevention, and safety in the home. She verbalizes and demonstrates understanding of all education topics. No further acute OT needs identified. OT will sign off.     Follow Up Recommendations  No OT follow up;Supervision/Assistance - 24 hour    Equipment Recommendations       Recommendations for Other Services       Precautions / Restrictions Precautions Precautions: Fall Restrictions Weight Bearing Restrictions: Yes LLE Weight Bearing: Weight bearing as tolerated      Mobility Bed Mobility Overal bed mobility: Needs Assistance Bed Mobility: Supine to Sit     Supine to sit: Supervision     General bed mobility comments: Supervision for safety.   Transfers Overall transfer level: Modified independent Equipment used: Rolling walker (2 wheeled) Transfers: Sit to/from Stand Sit to Stand: Modified independent (Device/Increase time)         General transfer comment: No assistance required to stand and good hand placement noted.     Balance Overall balance assessment: Needs assistance Sitting-balance support: No upper extremity supported;Feet supported Sitting balance-Leahy Scale: Fair Sitting balance - Comments: Pt  was able to sit without UE support while PT put on gait belt, but had some unsteadiness.    Standing balance support: Bilateral upper extremity supported Standing balance-Leahy Scale: Fair Standing balance comment: Able to statically stand without UE support.                            ADL either performed or assessed with clinical judgement   ADL Overall ADL's : Needs assistance/impaired Eating/Feeding: Independent;Sitting   Grooming: Modified independent;Standing   Upper Body Bathing: Independent;Sitting   Lower Body Bathing: Modified independent;Sit to/from stand   Upper Body Dressing : Independent;Sitting   Lower Body Dressing: Modified independent;Sit to/from stand   Toilet Transfer: Modified Independent;Ambulation;BSC;Comfort height toilet Toilet Transfer Details (indicate cue type and reason): BSC over toilet Toileting- Clothing Manipulation and Hygiene: Modified independent;Sit to/from stand     Tub/Shower Transfer Details (indicate cue type and reason): Pt declined practicing shower transfer. She reports that she will wait until more recovered prior to stepping into shower. She is able to verbalize technique.  Functional mobility during ADLs: Modified independent;Rolling walker General ADL Comments: Pt and mother educated concerning compensatory ADL strategies as well as fall precautions. She demonstrates good understanding of precautions and strategies.      Vision Baseline Vision/History: Wears glasses Wears Glasses: At all times Patient Visual Report: No change from baseline Vision Assessment?: No apparent visual deficits     Perception     Praxis      Pertinent Vitals/Pain Pain Assessment: Faces Faces Pain Scale: Hurts little more Pain Location: L Hip Pain Descriptors / Indicators: Grimacing;Guarding;Moaning Pain Intervention(s): Limited activity within patient's tolerance;Monitored during session;Repositioned  Hand Dominance Right    Extremity/Trunk Assessment Upper Extremity Assessment Upper Extremity Assessment: Overall WFL for tasks assessed   Lower Extremity Assessment Lower Extremity Assessment: LLE deficits/detail LLE Deficits / Details: Decreased strength and ROM as expected post-operatively.    Cervical / Trunk Assessment Cervical / Trunk Assessment: Normal   Communication Communication Communication: No difficulties   Cognition Arousal/Alertness: Awake/alert Behavior During Therapy: WFL for tasks assessed/performed Overall Cognitive Status: Within Functional Limits for tasks assessed                                     General Comments  Pt's mother present and engaged in session.     Exercises     Shoulder Instructions      Home Living Family/patient expects to be discharged to:: Private residence Living Arrangements: Alone Available Help at Discharge: Family;Available 24 hours/day(Parents will be staying with her for 2 weeks) Type of Home: Apartment Home Access: Stairs to enter Entrance Stairs-Number of Steps: 25 Entrance Stairs-Rails: Right Home Layout: One level     Bathroom Shower/Tub: Teacher, early years/pre: Standard     Home Equipment: Toilet riser;Cane - single point          Prior Functioning/Environment Level of Independence: Independent;Independent with assistive device(s)        Comments: Was occasionally using a cane  but was working and independent with ADL and IADL        OT Problem List: Impaired balance (sitting and/or standing);Decreased safety awareness;Decreased knowledge of precautions;Pain      OT Treatment/Interventions:      OT Goals(Current goals can be found in the care plan section) Acute Rehab OT Goals Patient Stated Goal: To walk normally OT Goal Formulation: With patient/family  OT Frequency:     Barriers to D/C:            Co-evaluation              AM-PAC PT "6 Clicks" Daily Activity     Outcome  Measure Help from another person eating meals?: None Help from another person taking care of personal grooming?: None Help from another person toileting, which includes using toliet, bedpan, or urinal?: None Help from another person bathing (including washing, rinsing, drying)?: None Help from another person to put on and taking off regular upper body clothing?: None Help from another person to put on and taking off regular lower body clothing?: None 6 Click Score: 24   End of Session Nurse Communication: Mobility status  Activity Tolerance: Patient tolerated treatment well Patient left: in bed;with call bell/phone within reach;with nursing/sitter in room  OT Visit Diagnosis: Other abnormalities of gait and mobility (R26.89);Pain Pain - Right/Left: Left Pain - part of body: Hip                Time: 1324-4010 OT Time Calculation (min): 20 min Charges:  OT General Charges $OT Visit: 1 Visit OT Evaluation $OT Eval Low Complexity: 1 Low OT Treatments $Self Care/Home Management : 8-22 mins  Norman Herrlich, MS OTR/L  Pager: [place] A Denelda Akerley 03/30/2018, 11:13 AM

## 2018-03-30 NOTE — Care Management Note (Signed)
Case Management Note  Patient Details  Name: JAYLIANNA TATLOCK MRN: [id number] Date of Birth: [birth year]  Subjective/Objective:  47 yr old female s/p left total hip arthroplasty, anterior approach.                   Action/Plan: Case manager spoke with patient concerning discharge plan, she was  preoperatively setup with [facility], no changes. Patient says her parents will assist her at discharge.    Expected Discharge Date:  03/30/18               Expected Discharge Plan:  [place]  In-House Referral:  NA  Discharge planning Services  CM Consult  Post Acute Care Choice:  Home Health Choice offered to:  Patient  DME Arranged:  (HAS DME) DME Agency:  [employer]  HH Arranged:  PT HH Agency:  [facility] (formerly [employer])  Status of Service:  Completed, signed off  If discussed at [employer], dates discussed:    Additional Comments:  Ninfa Meeker, RN 03/30/2018, 12:59 PM

## 2018-03-30 NOTE — Progress Notes (Signed)
Patient stated "left side of face feels numb". RN did neuro check, checked smile, hand strength, foot strength, and speech. While smiling left side of face twitched, moderate hand strength, and moderate foot strength. Patient has history of MS. Roxan Hockey, PA was notified of situation and findings. Awaiting interventions. Will continue to monitor patient.

## 2018-03-30 NOTE — Progress Notes (Signed)
Physical Therapy Treatment Patient Details Name: Gloria Reeves MRN: [id number] DOB: [birth year] Today's Date: 03/30/2018    History of Present Illness Gloria Reeves is a 47 y.o F s/p L THR Direct Anterior. PMH includes Arthritis, OA, MS, Esophageal Reflex, Avascular Necrosis, GERD, and R THR.     PT Comments    Pt on arrival reports feeling dizzy and nauseous but motivated to mobilize.  Pt limited to short gt trials of 20 ft x2 due to complaints.  BP obtained 136/87(seated), 134/90 (standing), and 134/75 (reclined).  Pressure unchanged with change in positions.  Pt returned to room and given saltines for nausea.  Pt educated to try and eat when lunch comes to reduce nausea with use of pain meds.  Plan for return this pm for stair training and exercise program instruction.     Follow Up Recommendations  Follow surgeon's recommendation for DC plan and follow-up therapies;Supervision/Assistance - 24 hour     Equipment Recommendations  Rolling walker with 5" wheels;3in1 (PT)    Recommendations for Other Services       Precautions / Restrictions Precautions Precautions: Fall Restrictions Weight Bearing Restrictions: (P) Yes LLE Weight Bearing: (P) Weight bearing as tolerated    Mobility  Bed Mobility Overal bed mobility: Needs Assistance Bed Mobility: Supine to Sit     Supine to sit: Supervision     General bed mobility comments: Supervision for safety.   Transfers Overall transfer level: Needs assistance Equipment used: Rolling walker (2 wheeled) Transfers: Sit to/from Stand Sit to Stand: Supervision         General transfer comment: Cues for hand placement to and from seated surface.    Ambulation/Gait Ambulation/Gait assistance: Min guard Gait Distance (Feet): 20 Feet(x2 trials.  ) Assistive device: Rolling walker (2 wheeled) Gait Pattern/deviations: Step-to pattern;Decreased weight shift to left;Decreased step length - right;Decreased stance time -  left;Decreased stride length;Drifts right/left;Step-through pattern Gait velocity: Decreased   General Gait Details: Cues for progression to step through pattern.  Pt limited due to complaints of dizziness.  Cheeks on patient are flushed.  Check BP and WNL and unchanged with positions.      Stairs             Wheelchair Mobility    Modified Rankin (Stroke Patients Only)       Balance Overall balance assessment: Needs assistance Sitting-balance support: No upper extremity supported;Feet supported Sitting balance-Leahy Scale: Fair Sitting balance - Comments: Pt was able to sit without UE support while PT put on gait belt, but had some unsteadiness.    Standing balance support: Bilateral upper extremity supported Standing balance-Leahy Scale: Fair Standing balance comment: Able to statically stand without UE support.                             Cognition Arousal/Alertness: Lethargic;Suspect due to medications Behavior During Therapy: Flat affect Overall Cognitive Status: Within Functional Limits for tasks assessed                                 General Comments: Pt was sleepy, likely secondary to pain medication, but was able to answer questions clearly and responded to questions and cues.       Exercises      General Comments General comments (skin integrity, edema, etc.): Pt's mother present and engaged in session.       Pertinent Vitals/Pain  Pain Assessment: 0-10 Pain Score: 4  Faces Pain Scale: Hurts little more Pain Location: L Hip Pain Descriptors / Indicators: Grimacing;Guarding Pain Intervention(s): Monitored during session;Repositioned    Home Living Family/patient expects to be discharged to:: Private residence Living Arrangements: Alone Available Help at Discharge: Family;Available 24 hours/day(Parents will be staying with her for 2 weeks) Type of Home: Apartment Home Access: Stairs to enter Entrance Stairs-Rails:  Right Home Layout: One level Home Equipment: Toilet riser;Cane - single point      Prior Function Level of Independence: Independent;Independent with assistive device(s)      Comments: Was occasionally using a cane  but was working and independent with ADL and IADL   PT Goals (current goals can now be found in the care plan section) Acute Rehab PT Goals Patient Stated Goal: To walk normally Potential to Achieve Goals: Good Progress towards PT goals: Progressing toward goals    Frequency    7X/week      PT Plan Current plan remains appropriate    Co-evaluation              AM-PAC PT "6 Clicks" Daily Activity  Outcome Measure  Difficulty turning over in bed (including adjusting bedclothes, sheets and blankets)?: Unable Difficulty moving from lying on back to sitting on the side of the bed? : Unable Difficulty sitting down on and standing up from a chair with arms (e.g., wheelchair, bedside commode, etc,.)?: Unable Help needed moving to and from a bed to chair (including a wheelchair)?: A Little Help needed walking in hospital room?: A Little Help needed climbing 3-5 steps with a railing? : A Lot 6 Click Score: 11    End of Session Equipment Utilized During Treatment: Gait belt Activity Tolerance: Patient limited by pain;Patient limited by fatigue Patient left: in chair;with call bell/phone within reach;with family/visitor present Nurse Communication: Mobility status PT Visit Diagnosis: Unsteadiness on feet (R26.81);Other abnormalities of gait and mobility (R26.89);Muscle weakness (generalized) (M62.81);Difficulty in walking, not elsewhere classified (R26.2);Pain;Other symptoms and signs involving the nervous system (R29.898) Pain - Right/Left: Left Pain - part of body: Hip     Time: 2025-4270 PT Time Calculation (min) (ACUTE ONLY): 19 min  Charges:  $Gait Training: 8-22 mins                     Governor Rooks, PTA pager [place] 03/30/2018, 11:42 AM

## 2018-03-30 NOTE — Progress Notes (Signed)
Physical Therapy Treatment Patient Details Name: Gloria Reeves MRN: [id number] DOB: [birth year] Today's Date: 03/30/2018    History of Present Illness Gloria Reeves is a 47 y.o F s/p L THR Direct Anterior. PMH includes Arthritis, OA, MS, Esophageal Reflex, Avascular Necrosis, GERD, and R THR.     PT Comments    Pt remains to c/o "not feeling well", performed LE supine exercises and mobility with supervision to min guard assistance.  Pt with x1 instance of balance instability and c/o dizziness.  Returned to seated position and BP obtained 142/95.  Pt remains to reports she does not feel well and Nurse and PTA assisted patient back to room.  Will plan for stair training next session if patient is able to tolerate.      Follow Up Recommendations  Follow surgeon's recommendation for DC plan and follow-up therapies;Supervision/Assistance - 24 hour     Equipment Recommendations  Rolling walker with 5" wheels;3in1 (PT)    Recommendations for Other Services OT consult     Precautions / Restrictions Precautions Precautions: Fall Restrictions Weight Bearing Restrictions: Yes LLE Weight Bearing: Weight bearing as tolerated    Mobility  Bed Mobility Overal bed mobility: Needs Assistance Bed Mobility: Supine to Sit     Supine to sit: Supervision     General bed mobility comments: Supervision for safety.   Transfers Overall transfer level: Needs assistance Equipment used: Rolling walker (2 wheeled) Transfers: Sit to/from Stand Sit to Stand: Supervision         General transfer comment: Cues for hand placement to and from seated surface.    Ambulation/Gait Ambulation/Gait assistance: Min guard Gait Distance (Feet): 30 Feet Assistive device: Rolling walker (2 wheeled) Gait Pattern/deviations: Step-to pattern;Decreased weight shift to left;Decreased step length - right;Decreased stance time - left;Decreased stride length;Drifts right/left;Step-through pattern Gait  velocity: Decreased   General Gait Details: Pt remains to require cues for progression to step through pattern, cues for upper trunk control and pacing.  Pt limited due to c/o dizziness.  Pressure obtained 142/95.    Stairs Stairs: Yes(Pt refused stairs this pm due to dizziness and fatigue.  )           Wheelchair Mobility    Modified Rankin (Stroke Patients Only)       Balance Overall balance assessment: Needs assistance Sitting-balance support: No upper extremity supported;Feet supported Sitting balance-Leahy Scale: Fair Sitting balance - Comments: Pt was able to sit without UE support while PT put on gait belt, but had some unsteadiness.    Standing balance support: Bilateral upper extremity supported Standing balance-Leahy Scale: Poor Standing balance comment: Able to statically stand without UE support.                             Cognition Arousal/Alertness: Lethargic;Suspect due to medications Behavior During Therapy: Flat affect Overall Cognitive Status: Within Functional Limits for tasks assessed                                 General Comments: Pt was sleepy, likely secondary to pain medication, but was able to answer questions clearly and responded to questions and cues.       Exercises Total Joint Exercises Ankle Circles/Pumps: AROM;20 reps;Supine;Both Quad Sets: AROM;Left;10 reps;Supine Short Arc Quad: AROM;Left;10 reps;Supine Heel Slides: AROM;Left;10 reps;Supine Hip ABduction/ADduction: AROM;Left;10 reps;Supine    General Comments General comments (skin integrity, edema, etc.):  Pt's mother present and engaged in session.       Pertinent Vitals/Pain Pain Assessment: 0-10 Pain Score: 4  Faces Pain Scale: Hurts little more Pain Location: L Hip Pain Descriptors / Indicators: Grimacing;Guarding Pain Intervention(s): Monitored during session;Repositioned    Home Living Family/patient expects to be discharged to:: Private  residence Living Arrangements: Alone Available Help at Discharge: Family;Available 24 hours/day(Parents will be staying with her for 2 weeks) Type of Home: Apartment Home Access: Stairs to enter Entrance Stairs-Rails: Right Home Layout: One level Home Equipment: Toilet riser;Cane - single point      Prior Function Level of Independence: Independent;Independent with assistive device(s)      Comments: Was occasionally using a cane  but was working and independent with ADL and IADL   PT Goals (current goals can now be found in the care plan section) Acute Rehab PT Goals Patient Stated Goal: To walk normally Potential to Achieve Goals: Good Progress towards PT goals: Progressing toward goals    Frequency    7X/week      PT Plan Current plan remains appropriate    Co-evaluation              AM-PAC PT "6 Clicks" Daily Activity  Outcome Measure  Difficulty turning over in bed (including adjusting bedclothes, sheets and blankets)?: Unable Difficulty moving from lying on back to sitting on the side of the bed? : Unable Difficulty sitting down on and standing up from a chair with arms (e.g., wheelchair, bedside commode, etc,.)?: Unable Help needed moving to and from a bed to chair (including a wheelchair)?: A Little Help needed walking in hospital room?: A Little Help needed climbing 3-5 steps with a railing? : A Lot 6 Click Score: 11    End of Session Equipment Utilized During Treatment: Gait belt Activity Tolerance: Patient limited by pain;Patient limited by fatigue Patient left: in chair;with call bell/phone within reach;with family/visitor present Nurse Communication: Mobility status PT Visit Diagnosis: Unsteadiness on feet (R26.81);Other abnormalities of gait and mobility (R26.89);Muscle weakness (generalized) (M62.81);Difficulty in walking, not elsewhere classified (R26.2);Pain;Other symptoms and signs involving the nervous system (R29.898) Pain - Right/Left:  Left Pain - part of body: Hip     Time: 1344-1400 PT Time Calculation (min) (ACUTE ONLY): 16 min  Charges:  $Gait Training: 8-22 mins                     Governor Rooks, PTA pager [place] 03/30/2018, 2:08 PM

## 2018-03-31 MED ORDER — LABETALOL HCL 5 MG/ML IV SOLN
10.0000 mg | INTRAVENOUS | Status: DC | PRN
  Filled 2018-03-31: qty 4

## 2018-03-31 MED ORDER — LABETALOL HCL 100 MG PO TABS
100.0000 mg | ORAL_TABLET | Freq: Once | ORAL | Status: AC
  Administered 2018-03-31: 100 mg via ORAL
  Filled 2018-03-31: qty 1

## 2018-03-31 NOTE — Progress Notes (Signed)
Patient and family member (mom) c/o bed not being made, food being cold, and bathroom not being cleaned. RN notified CN and AD of situation. RN apologized for situation repeatedly. RN stated that room and bathroom are cleaned daily and that RN was in room with environmental services 03/30/18, and saw room being cleaned. RN and NT offered to heat up food, family declined. RN supplied room with extra toilet paper. NT remade bed for patient, which has been completed daily since beginning of stay. RN contacted environmental services, they will be cleaning room/bathroom.   RN educated patient on ambulating with walker in room, ambulating with assistance, and needs to use call light for assistance. Pt verbalized understanding. Will continue to monitor patient and situation.

## 2018-03-31 NOTE — Progress Notes (Signed)
    Subjective: Patient feeling well / better this morning.  Patient reports pain as mild.  Tolerating diet.  Urinating.   Early mobilization with therapy.    She had some dizziness and headache with standing/exercises yesterday which coincided with elevated blood pressures.  Orthostatics were stable.  No CP, SOB, extremity weakness, or difficulty with speech.    Objective:   VITALS:   Vitals:   03/30/18 1430 03/30/18 1442 03/30/18 2044 03/31/18 0347  BP: (!) 142/95 (!) 139/92 133/90 (!) 144/91  Pulse: 86 81 82 92  Resp: 17 16 16 16   Temp:  98.2 F (36.8 C) 98.5 F (36.9 C) 98.4 F (36.9 C)  TempSrc:  Oral Oral Oral  SpO2:  97% 98% 96%  Weight:      Height:       CBC Latest Ref Rng & Units 03/30/2018 03/17/2018  WBC 4.0 - 10.5 K/uL 11.2(H) 9.5  Hemoglobin 12.0 - 15.0 g/dL 12.7 13.5  Hematocrit 36.0 - 46.0 % 39.3 41.8  Platelets 150 - 400 K/uL 273 337   BMP Latest Ref Rng & Units 03/30/2018 03/17/2018  Glucose 70 - 99 mg/dL 172(H) 102(H)  BUN 6 - 20 mg/dL 6 9  Creatinine 0.44 - 1.00 mg/dL 0.89 0.79  Sodium 135 - 145 mmol/L 138 141  Potassium 3.5 - 5.1 mmol/L 4.2 4.1  Chloride 98 - 111 mmol/L 103 108  CO2 22 - 32 mmol/L 24 22  Calcium 8.9 - 10.3 mg/dL 9.2 9.1   Intake/Output      08/14 0701 - 08/15 0700   I.V. (mL/kg) 2041 (21.1)   IV Piggyback 0   Total Intake(mL/kg) 2041 (21.1)   Net +2041       Urine Occurrence 2 x      Physical Exam: General: NAD.  Upright in bed on arrival.  Calm, Conversant.  Mother at bedside. Resp: No increased wob Cardio: High normal rate.  Regular rhythm ABD soft Neurologically intact without gross focal deficit.  EOMI.  Facial motions symmetrical.  Speech normal.  Moves all extremities independently. MSK Neurovascularly intact Sensation intact distally Intact pulses distally Dorsiflexion/Plantar flexion intact Incision: dressing C/D/I   Assessment: 2 Days Post-Op  S/P Procedure(s) (LRB): LEFT TOTAL HIP ARTHROPLASTY ANTERIOR  APPROACH (Left) by Dr. Ernesta Amble. Percell Miller on 01/27/2017  Principal Problem:   Primary osteoarthritis of left hip Active Problems:   Multiple sclerosis, relapsing-remitting (HCC)   Avascular necrosis of bone of hip, left (HCC)   Esophageal reflux   Primary osteoarthritis of hip   Primary osteoarthritis, status post left total hip arthroplasty Doing well postop day 2 Eating, drinking, and voiding Pain controlled Early mobilization with therapy  Some dizziness and headache only when standing/exertion with PT yesterday.  No CP/SOB.  This seems to be associated with her elevated blood pressures with exertion.   Orthostatics were stable.  Hemoglobin WNL.  Plan: Up with therapy Follow blood pressure: Labetalol for hypertension  Will decrease gabapentin to home dose and D/C Celebrex. Incentive Spirometry Apply ice   Weight Bearing: Weight Bearing as Tolerated (WBAT) LLE Dressings: Maintain Mepilex.   VTE prophylaxis: Aspirin, SCDs, ambulation Dispo: Home after therapy today.   Prudencio Burly III, PA-C 03/31/2018, 6:49 AM

## 2018-03-31 NOTE — Progress Notes (Signed)
Gloria Reeves to be D/C'd Home per MD order.  Discussed prescriptions and follow up appointments with the patient. Prescriptions given to patient, medication list explained in detail. Pt verbalized understanding.  Allergies as of 03/31/2018      Reactions   Keppra [levetiracetam]    Unknown   Sulfa Antibiotics    Unknown      Medication List    TAKE these medications   acetaminophen 500 MG tablet Commonly known as:  TYLENOL Take 2 tablets (1,000 mg total) by mouth every 8 (eight) hours for 14 days. For Pain. What changed:    medication strength  how much to take  when to take this  reasons to take this  additional instructions   aspirin EC 81 MG tablet Take 1 tablet (81 mg total) by mouth 2 (two) times daily. For DVT prophylaxis   DEPO-PROVERA 150 MG/ML injection Generic drug:  medroxyPROGESTERone Inject 150 mg into the muscle every 3 (three) months.   docusate sodium 100 MG capsule Commonly known as:  COLACE Take 1 capsule (100 mg total) by mouth 2 (two) times daily. To prevent constipation while taking pain medication.   folic acid 1 MG tablet Commonly known as:  FOLVITE Take 1 mg by mouth See admin instructions. Take 1mg  by mouth daily except on methotrexate day   gabapentin 300 MG capsule Commonly known as:  NEURONTIN Take 1 capsule (300 mg total) by mouth 2 (two) times daily for 14 days. For 2 weeks post op for pain. What changed:  additional instructions   meloxicam 15 MG tablet Commonly known as:  MOBIC Take 15 mg by mouth daily.   methocarbamol 500 MG tablet Commonly known as:  ROBAXIN Take 1 tablet (500 mg total) by mouth every 6 (six) hours as needed for muscle spasms.   methotrexate 2.5 MG tablet Commonly known as:  RHEUMATREX Take 15 mg by mouth once a week.   multivitamin tablet Take 1 tablet by mouth daily.   omeprazole 20 MG capsule Commonly known as:  PRILOSEC Take 20 mg by mouth daily.   ondansetron 4 MG tablet Commonly known  as:  ZOFRAN Take 1 tablet (4 mg total) by mouth every 8 (eight) hours as needed for nausea or vomiting.   oxyCODONE 5 MG immediate release tablet Commonly known as:  Oxy IR/ROXICODONE Take 1 tablet (5 mg total) by mouth every 4 (four) hours as needed for breakthrough pain.   Vitamin D (Ergocalciferol) 50000 units Caps capsule Commonly known as:  DRISDOL Take 1 capsule (50,000 Units total) by mouth every 7 (seven) days.       Vitals:   03/31/18 0347 03/31/18 1039  BP: (!) 144/91   Pulse: 92 90  Resp: 16   Temp: 98.4 F (36.9 C)   SpO2: 96%     Skin clean, dry and intact without evidence of skin break down, no evidence of skin tears noted. Mepilex dressing on left hip clean, dry, and intact with old drainage marked. IV catheter discontinued intact. Site without signs and symptoms of complications. Dressing and pressure applied. Pt denies pain at this time. No complaints noted.  An After Visit Summary and prescriptions were printed and given to the patient. Patient escorted via [place], and D/C home via private auto.  [place] RN

## 2018-03-31 NOTE — Progress Notes (Signed)
Physical Therapy Treatment Patient Details Name: Gloria Reeves MRN: [id number] DOB: 1971/02/27 Today's Date: 03/31/2018    History of Present Illness Gloria Reeves is a 47 y.o F s/p L THR Direct Anterior. PMH includes Arthritis, OA, MS, Esophageal Reflex, Avascular Necrosis, GERD, and R THR.     PT Comments    Pt continues to make steady progress towards goals, performing ambulation with supervision and negotiating stairs with min guard. Pt denied any reports of nassea or light headedness which limited the pt in previous session. Pt required 2-3 standing rest breaks second to L hip pain s/p stair performance, but did not require a seated rest. Verbally reviewed therapeutic exercises with pt as she recalled how to perform all, except quad sets which she recalled once given tactile cues. Based on performance from first session believe pt is safe to be d/c without an afternoon session. Spoke again with pt and family per RN request and pt verbalizing that she felt comfortable being d/c without another PT session as well. Pt left in chair with family members present and all needs met. Will continue to follow acutely to improve independence with mobility if pt is not discharged today.    Follow Up Recommendations  Follow surgeon's recommendation for DC plan and follow-up therapies;Supervision/Assistance - 24 hour     Equipment Recommendations  Rolling walker with 5" wheels;3in1 (PT)    Recommendations for Other Services       Precautions / Restrictions Precautions Precautions: Fall Restrictions Weight Bearing Restrictions: Yes LLE Weight Bearing: Weight bearing as tolerated    Mobility  Bed Mobility               General bed mobility comments: Pt standing in room upon arrival  Transfers Overall transfer level: Needs assistance Equipment used: Rolling walker (2 wheeled) Transfers: Sit to/from Stand Sit to Stand: Supervision         General transfer comment: Min cues  for hand placement and control with descent   Ambulation/Gait Ambulation/Gait assistance: Supervision Gait Distance (Feet): 400 Feet Assistive device: Rolling walker (2 wheeled) Gait Pattern/deviations: Step-through pattern;Decreased step length - left;Decreased stance time - left;Decreased weight shift to left;Antalgic Gait velocity: Decreased Gait velocity interpretation: <1.31 ft/sec, indicative of household ambulator General Gait Details: Pt demonstrated step through pattern without need for cues; VCs to push RW for energy conservation as pt intermittently lifted RW with gait; min cues for upright posture and remaining within RW    Stairs Stairs: Yes Stairs assistance: Min guard Stair Management: One rail Right;One rail Left;Step to pattern;Forwards Number of Stairs: 10 General stair comments: Pt initially anxious to perform stairs requiring VCs for hand placement ascending with BIL UE support on R rail progressing to single UE support; Descended stairs sideways with bil UE support progressing to forwards descent with single extremity support. VCs for pacing when negotiating multiple stairs    Wheelchair Mobility    Modified Rankin (Stroke Patients Only)       Balance Overall balance assessment: Needs assistance Sitting-balance support: No upper extremity supported;Feet supported Sitting balance-Leahy Scale: Good     Standing balance support: No upper extremity supported Standing balance-Leahy Scale: Fair Standing balance comment: Able to perform dynamic standing activities without UE support but increased unsteadiness noted without bil UE support.                            Cognition Arousal/Alertness: Awake/alert Behavior During Therapy: WFL for tasks assessed/performed  Overall Cognitive Status: Within Functional Limits for tasks assessed                                        Exercises Total Joint Exercises Quad Sets: AROM;Left;Seated;5  reps(with legs reclined)    General Comments General comments (skin integrity, edema, etc.): Mother present during session; remained in room when pt ambulated and performed stairs      Pertinent Vitals/Pain Pain Assessment: 0-10 Pain Score: 5  Pain Location: L Hip Pain Descriptors / Indicators: Sore;Operative site guarding;Discomfort Pain Intervention(s): Limited activity within patient's tolerance;Monitored during session;Ice applied    Home Living                      Prior Function            PT Goals (current goals can now be found in the care plan section) Acute Rehab PT Goals Patient Stated Goal: To walk normally PT Goal Formulation: With patient Time For Goal Achievement: 04/12/18 Potential to Achieve Goals: Good Progress towards PT goals: Progressing toward goals    Frequency    7X/week      PT Plan Current plan remains appropriate    Co-evaluation              AM-PAC PT "6 Clicks" Daily Activity  Outcome Measure  Difficulty turning over in bed (including adjusting bedclothes, sheets and blankets)?: A Little Difficulty moving from lying on back to sitting on the side of the bed? : A Little Difficulty sitting down on and standing up from a chair with arms (e.g., wheelchair, bedside commode, etc,.)?: A Little Help needed moving to and from a bed to chair (including a wheelchair)?: A Little Help needed walking in hospital room?: None Help needed climbing 3-5 steps with a railing? : A Little 6 Click Score: 19    End of Session Equipment Utilized During Treatment: Gait belt Activity Tolerance: Patient tolerated treatment well Patient left: in chair;with call bell/phone within reach;with family/visitor present Nurse Communication: Mobility status PT Visit Diagnosis: Other abnormalities of gait and mobility (R26.89);Unsteadiness on feet (R26.81);Pain Pain - Right/Left: Left Pain - part of body: Hip     Time: 0379-4446 PT Time Calculation  (min) (ACUTE ONLY): 20 min  Charges:  $Gait Training: 8-22 mins                     Einar Crow, [place]  Student Physical Therapist Acute Rehab [phone]    Einar Crow 03/31/2018, 12:47 PM

## 2018-04-14 ENCOUNTER — Other Ambulatory Visit: Payer: Self-pay | Admitting: Family Medicine

## 2018-04-14 DIAGNOSIS — E041 Nontoxic single thyroid nodule: Secondary | ICD-10-CM

## 2018-04-21 ENCOUNTER — Ambulatory Visit
Admission: RE | Admit: 2018-04-21 | Discharge: 2018-04-21 | Disposition: A | Discharge status: Home / Self Care | Payer: BLUE CROSS/BLUE SHIELD | Source: Ambulatory Visit | Attending: Family Medicine | Admitting: Family Medicine

## 2018-04-21 DIAGNOSIS — E041 Nontoxic single thyroid nodule: Secondary | ICD-10-CM

## 2018-04-29 ENCOUNTER — Encounter: Payer: Self-pay | Admitting: Neurology

## 2018-04-29 ENCOUNTER — Other Ambulatory Visit (INDEPENDENT_AMBULATORY_CARE_PROVIDER_SITE_OTHER): Payer: BLUE CROSS/BLUE SHIELD

## 2018-04-29 ENCOUNTER — Ambulatory Visit (INDEPENDENT_AMBULATORY_CARE_PROVIDER_SITE_OTHER): Payer: BLUE CROSS/BLUE SHIELD | Admitting: Neurology

## 2018-04-29 VITALS — BP 120/84 | HR 101 | Ht 66.0 in | Wt 196.1 lb

## 2018-04-29 DIAGNOSIS — G35 Multiple sclerosis: Secondary | ICD-10-CM | POA: Diagnosis not present

## 2018-04-29 DIAGNOSIS — E538 Deficiency of other specified B group vitamins: Secondary | ICD-10-CM | POA: Diagnosis not present

## 2018-04-29 LAB — VITAMIN B12: Vitamin B-12: 439 pg/mL (ref 211–911)

## 2018-04-29 NOTE — Patient Instructions (Addendum)
MRI brain and cervical spine wwo contrast  Check vitamin B12 and vitamin D  We will call you with the results  The referral's to [facility] for your MRI's are still available. You may call them directly to schedule your appt. They are located at [place]. Please call [phone].

## 2018-04-29 NOTE — Progress Notes (Signed)
Follow-up Visit   Date: 04/29/18    Gloria Reeves MRN: [id number] DOB: 03/14/1971   Interim History: Gloria Reeves is a 47 y.o.  left-handed African American female with asthma, vitamin B12 deficiency, mycosis fungoides, and panic attacks returning to the clinic for follow-up of multiple sclerosis.  The patient was accompanied to the clinic by self.   History of present illness: Early June 2017, she developed new onset severe biparietal throbbing headache which lasted all day.  She took ibuprofen which did not help, but the headache has resolved by the following morning.  However, the following day, she developed tingling over the left side of the neck, ear, and entire left arm, forearm, and hand.  Symptoms are constant and she has not identified triggers or alleviating factors.  She has throbbing numbness which is exacerbated by movement.  She endorses weakness of the entire left arm and is unable to write, dress herself, or take a shower.  She was evaluated in urgent care who gave her a course of prednisone and flexeril, but denies any improvement.  She was seen by her PCP 3-days ago for these symptoms who referred her for urgent evaluation.   In 2004, she had tingling of the hands and underwent MRI cervical spine and EMG which was essentially normal.    UPDATE 11/17/2017:  She was last seen in 2017 and had extensive evaluation which was consistent with multiple sclerosis, treated with IV steroids, and did not follow-up for further management.  She presents with request for surgical clearnace for right hip arthroplasty due to avascular necrosis.  She reports having improved shooting neck pain after being treated with steroids and no longer has any tingling over the left side of the body.  She has some hand numbness of the hands when she is taking a shower, which lasts about 10 -minutes.   UPDATE 04/29/2018:  She is here for follow-up visit.  She underwent bilateral hip  arthroplasty and is recovering from this.  At her last visit, I ordered MRI brian and cervical spine, but this was not performed. She denies any new neurological symptoms such as vision changes, numbness/tingling, or weakness.  She is not on any disease modifying therapies and despite discussing this at the last visit, she has not thought any more about this and is not interested at this time.    Medications:  Current Outpatient Medications on File Prior to Visit  Medication Sig Dispense Refill  . aspirin EC 81 MG tablet Take 1 tablet (81 mg total) by mouth 2 (two) times daily. For DVT prophylaxis 60 tablet 0  . docusate sodium (COLACE) 100 MG capsule Take 1 capsule (100 mg total) by mouth 2 (two) times daily. To prevent constipation while taking pain medication. 60 capsule 0  . folic acid (FOLVITE) 1 MG tablet Take 1 mg by mouth See admin instructions. Take 1m by mouth daily except on methotrexate day  11  . medroxyPROGESTERone (DEPO-PROVERA) 150 MG/ML injection Inject 150 mg into the muscle every 3 (three) months.    . meloxicam (MOBIC) 15 MG tablet Take 15 mg by mouth daily.  1  . methocarbamol (ROBAXIN) 500 MG tablet Take 1 tablet (500 mg total) by mouth every 6 (six) hours as needed for muscle spasms. 40 tablet 0  . methotrexate (RHEUMATREX) 2.5 MG tablet Take 15 mg by mouth once a week.  3  . Multiple Vitamin (MULTIVITAMIN) tablet Take 1 tablet by mouth daily.    .Marland Kitchen  omeprazole (PRILOSEC) 20 MG capsule Take 20 mg by mouth daily.    . ondansetron (ZOFRAN) 4 MG tablet Take 1 tablet (4 mg total) by mouth every 8 (eight) hours as needed for nausea or vomiting. 40 tablet 0  . oxyCODONE (OXY IR/ROXICODONE) 5 MG immediate release tablet TAKE 1 TABLET BYMOUTH EVERY 4 HOUR AS NEEDED FOR PAIN    . Vitamin D, Ergocalciferol, (DRISDOL) 50000 units CAPS capsule Take 1 capsule (50,000 Units total) by mouth every 7 (seven) days. 10 capsule 0  . gabapentin (NEURONTIN) 300 MG capsule Take 1 capsule (300 mg  total) by mouth 2 (two) times daily for 14 days. For 2 weeks post op for pain. 28 capsule 0   No current facility-administered medications on file prior to visit.     Allergies:  Allergies  Allergen Reactions  . Keppra [Levetiracetam]     Unknown  . Sulfa Antibiotics     Unknown    Review of Systems:  CONSTITUTIONAL: No fevers, chills, night sweats, or weight loss.  EYES: No visual changes or eye pain ENT: No hearing changes.  No history of nose bleeds.   RESPIRATORY: No cough, wheezing and shortness of breath.   CARDIOVASCULAR: Negative for chest pain, and palpitations.   GI: Negative for abdominal discomfort, blood in stools or black stools.  No recent change in bowel habits.   GU:  No history of incontinence.   MUSCLOSKELETAL: +history of joint pain or swelling.  No myalgias.   SKIN: Negative for lesions, rash, and itching.   ENDOCRINE: Negative for cold or heat intolerance, polydipsia or goiter.   PSYCH:  No depression or anxiety symptoms.   NEURO: As Above.   Vital Signs:  BP 120/84   Pulse (!) 101   Ht '5\' 6"'  (1.676 m)   Wt 196 lb 2 oz (89 kg)   SpO2 98%   BMI 31.66 kg/m    General Medical Exam:   General:  Well appearing, comfortable  Eyes/ENT: see cranial nerve examination.   Neck: No masses appreciated.  Full range of motion without tenderness.  No carotid bruits. Respiratory:  Clear to auscultation, good air entry bilaterally.   Cardiac:  Regular rate and rhythm, no murmur.   Ext:  No edema  Neurological Exam: MENTAL STATUS including orientation to time, place, person, recent and remote memory, attention span and concentration, language, and fund of knowledge is normal.  Speech is not dysarthric.  CRANIAL NERVES: No visual field defects.  Pupils equal round and reactive to light.  Normal conjugate, extra-ocular eye movements in all directions of gaze.  No ptosis. Normal facial sensation.  Face is symmetric. Palate elevates symmetrically.  Tongue is  midline.  MOTOR:  Motor strength is 5/5 in all extremities.  No atrophy, fasciculations or abnormal movements.  No pronator drift.  Tone is normal.    MSRs:  Reflexes are 2+/4 throughout. Plantars are down bilaterally.   SENSORY:  Intact to vibration throughout.  COORDINATION/GAIT:  Normal finger-to- nose-finger and heel-to-shin.  Intact rapid alternating movements bilaterally.  Gait antalgic due to right hip pain.  Data: MRI cervical spine wo contrast 08/05/2003:  There is mild cervical kyphosis. There is a small central disk herniation at C4-5.   MRI cervical spine wwo contrast 02/07/2016:  Enhancing cord lesion on the left at C1. No other cord lesions. This is most likely related to demyelinating disease. Cervical degenerative changes as above.  MRI brain wwo contrast 01/29/2016:    1. Focal area of  T2 hyperintensity in the upper cervical spine at the C1-2 level involving the left hemicord. This is concerning for a demyelinating process, raising the possibility of multiple sclerosis, transverse myelitis, or neuromyelitis optica (Devic's disease).  2. Mild periventricular and subcortical T2 changes bilaterally are slightly greater than expected for age. The finding is nonspecific but can be seen in the setting of chronic microvascular ischemia, a demyelinating process such as multiple sclerosis, vasculitis, complicated migraine headaches, or as the sequelae of a prior infectious or inflammatory process.  3. No other acute intracranial abnormality.  IMPRESSION/PLAN: Relapsing-remitting multiple sclerosis.  Diagnosed in 2017 based on imaging showing C1 demyelinating lesion and mild scattered white matter changes in the brain, as well as supporting CSF results.  She was treated with solumedrol infusions in the summer of 2017 and has resolution of Lhermitte's sign.  She did not wish to start DMT and therefore is not on any treatment for MS. At her last visit, I again discussed disease  modifying therapies and the need to update MRI brain and cervical spine, which she did not schedule. I explained the benefit of disease control with DMTs, she expresses understanding and does not want to be on medication. Regarding surveillance imaging (MRI brain and cervical spine), she is reluctant to proceed with this being asymptomatic, but did agree to have this ordered since she has already met her deductible for the year.  Check vitamin D level. She is not taking vitamin D 5000U as previously instructed  History of vitamin B12 deficiency.  Check vitamin B12 level  Further recommendations pending results  Thank you for allowing me to participate in patient's care.  If I can answer any additional questions, I would be pleased to do so.    Sincerely,    Donika K. Posey Pronto, DO

## 2018-05-02 ENCOUNTER — Encounter: Payer: Self-pay | Admitting: *Deleted

## 2018-05-02 ENCOUNTER — Telehealth: Payer: Self-pay | Admitting: *Deleted

## 2018-05-02 LAB — VITAMIN D 1,25 DIHYDROXY
VITAMIN D 1, 25 (OH) TOTAL: 29 pg/mL (ref 18–72)
VITAMIN D3 1, 25 (OH): 29 pg/mL
Vitamin D2 1, 25 (OH)2: <8 pg/mL

## 2018-05-02 NOTE — Telephone Encounter (Signed)
-----   Message from Alda Berthold, DO sent at 05/02/2018 10:34 AM EDT ----- Please notify patient lab are within normal limits.  She should continue vitamin D 5000 units daily. Thank you.

## 2018-05-02 NOTE — Telephone Encounter (Signed)
Results and instructions sent via My Chart.  

## 2018-12-15 ENCOUNTER — Telehealth: Payer: Self-pay | Admitting: Neurology

## 2018-12-15 NOTE — Telephone Encounter (Signed)
Patient has some questions and would like to know if she can do a Evisit with Posey Pronto. Please advise

## 2018-12-15 NOTE — Telephone Encounter (Signed)
PLEASE SET UP FOR EVISIT.

## 2018-12-16 NOTE — Telephone Encounter (Signed)
Noted  

## 2018-12-16 NOTE — Telephone Encounter (Signed)
LMOM to schedule a E Visit with Posey Pronto. Will try back

## 2018-12-20 NOTE — Telephone Encounter (Signed)
We have left 3 different VMs for E-visit. She has not called back. We will not reach out anymore. Awaiting her call back to schedule. Thanks!

## 2019-04-05 ENCOUNTER — Other Ambulatory Visit: Payer: Self-pay | Admitting: Family Medicine

## 2019-04-05 DIAGNOSIS — R7989 Other specified abnormal findings of blood chemistry: Secondary | ICD-10-CM

## 2019-04-13 ENCOUNTER — Ambulatory Visit
Admission: RE | Admit: 2019-04-13 | Discharge: 2019-04-13 | Disposition: A | Discharge status: Home / Self Care | Payer: BLUE CROSS/BLUE SHIELD | Source: Ambulatory Visit | Attending: Family Medicine | Admitting: Family Medicine

## 2019-04-13 DIAGNOSIS — R7989 Other specified abnormal findings of blood chemistry: Secondary | ICD-10-CM

## 2019-04-14 ENCOUNTER — Telehealth: Payer: Self-pay | Admitting: Neurology

## 2019-04-14 ENCOUNTER — Encounter: Payer: Self-pay | Admitting: Neurology

## 2019-04-14 NOTE — Telephone Encounter (Signed)
Pt left message on VM returning [employer] phone call

## 2019-04-16 NOTE — Progress Notes (Signed)
Follow-up Visit   Date: 04/17/19    Gloria Reeves MRN: [id number] DOB: August 23, 1970   Interim History: Gloria Reeves is a 48 y.o.  left-handed African American female with asthma, vitamin B12 deficiency, mycosis fungoides, and panic attacks returning to the clinic for follow-up of multiple sclerosis.  The patient was accompanied to the clinic by self.   History of present illness: Early June 2017, she developed new onset severe biparietal throbbing headache which lasted all day.  The following day, she developed tingling over the left side of the neck, ear, and entire left arm, forearm, and hand. Symptoms are constant and she has not identified triggers or alleviating factors.  She was evaluated in urgent care who gave her a course of prednisone and flexeril, but denies any improvement.   In 2004, she had tingling of the hands and underwent MRI cervical spine and EMG which was essentially normal.    UPDATE 11/17/2017:  She was last seen in 2017 and had extensive evaluation which was consistent with multiple sclerosis, treated with IV steroids, and did not follow-up for further management.  She presents with request for surgical clearnace for right hip arthroplasty due to avascular necrosis.  She reports having improved shooting neck pain after being treated with steroids and no longer has any tingling over the left side of the body.    UPDATE 04/29/2018:  She underwent bilateral hip arthroplasty and is recovering from this.  At her last visit, I ordered MRI brian and cervical spine, but this was not performed. She denies any new neurological symptoms such as vision changes, numbness/tingling, or weakness.  She is not on any disease modifying therapies and despite discussing this at the last visit, she has not thought any more about this and is not interested at this time.    UPDATE 04/16/2019:  She is here for follow-up visit.   Today, she presents with complaints of generalized pain  and back spasms.  She denies any numbness or tingling of the arms or legs.  She denies any weakness of the limbs.  She has not had any falls and uses a cane as needed because of ongoing hip pain.  She would like to be scheduled for MRI, which I have discussed at previous visits.  She is also interested in learning more about disease modifying therapies.  She tells me that her liver function studies have recently been elevated and she has seen a gastroenterologist for this.  She was told she has fatty liver.  I do not have any of her recent labs.  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  She will be moving to [place], [place] to live with her parents.  She has not established care with a primary care doctor or neurologist in [place].   Medications:  Current Outpatient Medications on File Prior to Visit  Medication Sig Dispense Refill  . medroxyPROGESTERone (DEPO-PROVERA) 150 MG/ML injection Inject 150 mg into the muscle every 3 (three) months.    . Multiple Vitamin (MULTIVITAMIN) tablet Take 1 tablet by mouth daily.    Marland Kitchen omeprazole (PRILOSEC) 20 MG capsule Take 20 mg by mouth daily.    Marland Kitchen aspirin EC 81 MG tablet Take 1 tablet (81 mg total) by mouth 2 (two) times daily. For DVT prophylaxis (Patient not taking: Reported on 04/14/2019) 60 tablet 0  . docusate sodium (COLACE) 100 MG capsule Take 1 capsule (100 mg total) by mouth 2 (two)  times daily. To prevent constipation while taking pain medication. (Patient not taking: Reported on 04/14/2019) 60 capsule 0  . folic acid (FOLVITE) 1 MG tablet Take 1 mg by mouth See admin instructions. Take 1mg  by mouth daily except on methotrexate day  11  . meloxicam (MOBIC) 15 MG tablet Take 15 mg by mouth daily.  1  . methocarbamol (ROBAXIN) 500 MG tablet Take 1 tablet (500 mg total) by mouth every 6 (six) hours as needed for muscle spasms. (Patient not taking: Reported on 04/14/2019) 40 tablet 0  . methotrexate (RHEUMATREX) 2.5 MG tablet  Take 15 mg by mouth once a week.  3  . ondansetron (ZOFRAN) 4 MG tablet Take 1 tablet (4 mg total) by mouth every 8 (eight) hours as needed for nausea or vomiting. (Patient not taking: Reported on 04/14/2019) 40 tablet 0  . oxyCODONE (OXY IR/ROXICODONE) 5 MG immediate release tablet TAKE 1 TABLET BYMOUTH EVERY 4 HOUR AS NEEDED FOR PAIN    . Vitamin D, Ergocalciferol, (DRISDOL) 50000 units CAPS capsule Take 1 capsule (50,000 Units total) by mouth every 7 (seven) days. (Patient not taking: Reported on 04/14/2019) 10 capsule 0   No current facility-administered medications on file prior to visit.     Allergies:  Allergies  Allergen Reactions  . Keppra [Levetiracetam]     Unknown  . Sulfa Antibiotics     Unknown    Review of Systems:  CONSTITUTIONAL: No fevers, chills, night sweats, or weight loss.  EYES: No visual changes or eye pain ENT: No hearing changes.  No history of nose bleeds.   RESPIRATORY: No cough, wheezing and shortness of breath.   CARDIOVASCULAR: Negative for chest pain, and palpitations.   GI: Negative for abdominal discomfort, blood in stools or black stools.  No recent change in bowel habits.   GU:  No history of incontinence.   MUSCLOSKELETAL: +history of joint pain or swelling.  No myalgias.   SKIN: Negative for lesions, rash, and itching.   ENDOCRINE: Negative for cold or heat intolerance, polydipsia or goiter.   PSYCH:  No depression or anxiety symptoms.   NEURO: As Above.   Vital Signs:  BP 132/90   Pulse (!) 137   Ht 5\' 6"  (1.676 m)   Wt 206 lb (93.4 kg)   SpO2 98%   BMI 33.25 kg/m    General Medical Exam:   General:  Well appearing, comfortable  Eyes/ENT: see cranial nerve examination.   Neck:   No carotid bruits. Respiratory:  Clear to auscultation, good air entry bilaterally.   Cardiac:  Regular rate and rhythm, no murmur.   Ext:  No edema  Neurological Exam: MENTAL STATUS including orientation to time, place, person, recent and remote memory,  attention span and concentration, language, and fund of knowledge is normal.  Speech is not dysarthric.  CRANIAL NERVES: No visual field defects.  Pupils equal round and reactive to light.  Normal conjugate, extra-ocular eye movements in all directions of gaze.  No ptosis. Normal facial sensation.  Face is symmetric.   MOTOR:  Motor strength is 5/5 in all extremities.  No atrophy, fasciculations or abnormal movements.  No pronator drift.  Tone is normal.  Lhermitte's negative  MSRs:  Reflexes are 2+/4 throughout.    SENSORY:  Intact to vibration throughout.  COORDINATION/GAIT:  Normal finger-to- nose-finger and heel-to-shin.  Intact rapid alternating movements bilaterally.  Gait is mildly wide-based, unassisted and appears stable.  Data: MRI cervical spine wo contrast 08/05/2003:  There is  mild cervical kyphosis. There is a small central disk herniation at C4-5.   MRI cervical spine wwo contrast 02/07/2016:  Enhancing cord lesion on the left at C1. No other cord lesions. This is most likely related to demyelinating disease. Cervical degenerative changes as above.  MRI brain wwo contrast 01/29/2016:    1. Focal area of T2 hyperintensity in the upper cervical spine at the C1-2 level involving the left hemicord. This is concerning for a demyelinating process, raising the possibility of multiple sclerosis, transverse myelitis, or neuromyelitis optica (Devic's disease).  2. Mild periventricular and subcortical T2 changes bilaterally are slightly greater than expected for age. The finding is nonspecific but can be seen in the setting of chronic microvascular ischemia, a demyelinating process such as multiple sclerosis, vasculitis, complicated migraine headaches, or as the sequelae of a prior infectious or inflammatory process.  3. No other acute intracranial abnormality.   IMPRESSION/PLAN: Relapsing-remitting multiple sclerosis.  Diagnosed in 2017 based on imaging showing C1 demyelinating  lesion and mild scattered white matter changes in the brain, as well as supporting CSF results.  She was treated with solumedrol infusions in the summer of 2017 and has resolution of Lhermitte's sign.  Disease modifying therapies have been discussed at prior visits, however she did not want to be started on anything has refused surveillance imaging.  Her last imaging was in 2017.   Today, she tells me that she is considering medication options, but given her upcoming move, it may be best that she discuss DMTs with her new neurologist and I have urged her to start to establish care with one.   She needs updated imaging on an MRI brain and cervical spine with and without contrast, which may further guide medication management.  For her painful paresthesias, I will start gabapentin 300mg  at bedtime x 1 week, then increase to 300mg  BID  She is not taking vitamin D 5000U as previously instructed  Greater than 50% of this 25 minute visit was spent in counseling, explanation of diagnosis, planning of further management, and coordination of care.   Thank you for allowing me to participate in patient's care.  If I can answer any additional questions, I would be pleased to do so.    Sincerely,    Donika K. Posey Pronto, DO

## 2019-04-17 ENCOUNTER — Other Ambulatory Visit: Payer: Self-pay

## 2019-04-17 ENCOUNTER — Encounter: Payer: Self-pay | Admitting: Neurology

## 2019-04-17 ENCOUNTER — Ambulatory Visit (INDEPENDENT_AMBULATORY_CARE_PROVIDER_SITE_OTHER): Payer: Self-pay | Admitting: Neurology

## 2019-04-17 VITALS — BP 132/90 | HR 137 | Ht 66.0 in | Wt 206.0 lb

## 2019-04-17 DIAGNOSIS — G35 Multiple sclerosis: Secondary | ICD-10-CM

## 2019-04-17 MED ORDER — GABAPENTIN 300 MG PO CAPS: ORAL_CAPSULE | ORAL | 2 refills | Status: DC

## 2019-04-17 NOTE — Patient Instructions (Addendum)
Start gabapentin 300mg  at bedtime x 1 week, then increase to 1 tablet twice daily  MRI brain and cervical spine wwo contrast.  Please request imaging center to burn CD with your images for your records  Please establish care with a local neurologist and primary care provider  We have sent a referral to [facility] for your MRI and they will call you directly to schedule your appointment. They are located at [place]. If you need to contact them directly please call [phone].

## 2019-04-19 ENCOUNTER — Other Ambulatory Visit: Payer: Self-pay

## 2019-04-19 ENCOUNTER — Telehealth: Payer: Self-pay | Admitting: Neurology

## 2019-04-19 ENCOUNTER — Ambulatory Visit: Payer: BLUE CROSS/BLUE SHIELD | Admitting: Neurology

## 2019-04-19 MED ORDER — GABAPENTIN 300 MG PO CAPS: ORAL_CAPSULE | ORAL | 2 refills | Status: AC

## 2019-04-19 NOTE — Telephone Encounter (Signed)
Re-sent to pharmacy.

## 2019-04-19 NOTE — Telephone Encounter (Signed)
Patient states that the pharmacy has not gotten her RX for the Gabapentin

## 2019-05-17 ENCOUNTER — Ambulatory Visit
Admission: RE | Admit: 2019-05-17 | Discharge: 2019-05-17 | Disposition: A | Discharge status: Home / Self Care | Payer: No Typology Code available for payment source | Source: Ambulatory Visit | Attending: Neurology | Admitting: Neurology

## 2019-05-17 DIAGNOSIS — G35 Multiple sclerosis: Secondary | ICD-10-CM

## 2019-05-17 MED ORDER — GADOBENATE DIMEGLUMINE 529 MG/ML IV SOLN
20.0000 mL | Freq: Once | INTRAVENOUS | Status: AC | PRN
  Administered 2019-05-17: 20 mL via INTRAVENOUS

## 2019-05-18 ENCOUNTER — Telehealth: Payer: Self-pay

## 2019-05-18 NOTE — Telephone Encounter (Signed)
Patient notified of results.

## 2019-05-18 NOTE — Telephone Encounter (Signed)
Close enounter

## 2019-11-22 IMAGING — US US THYROID
1 series · 14 of 25 positions shown · non-contrast
Comparison: 11/13/2011 and previous

CLINICAL DATA: Nodule. Previous FNA biopsy of mid left nodule
03/26/2009.

EXAM:
THYROID ULTRASOUND
TECHNIQUE: Ultrasound examination of the thyroid gland and adjacent soft
tissues was performed.

[Series 1: us thyroid · 0.07mm/px · 14 of 45 slices shown]
[im 1/45]
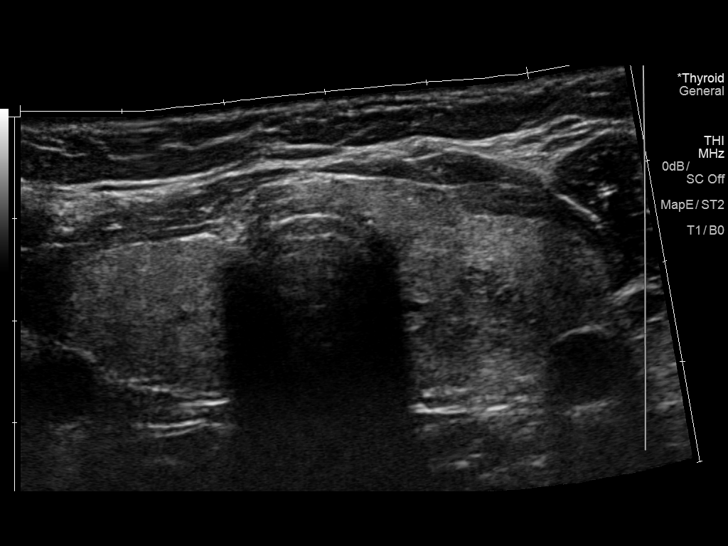
[im 4/45]
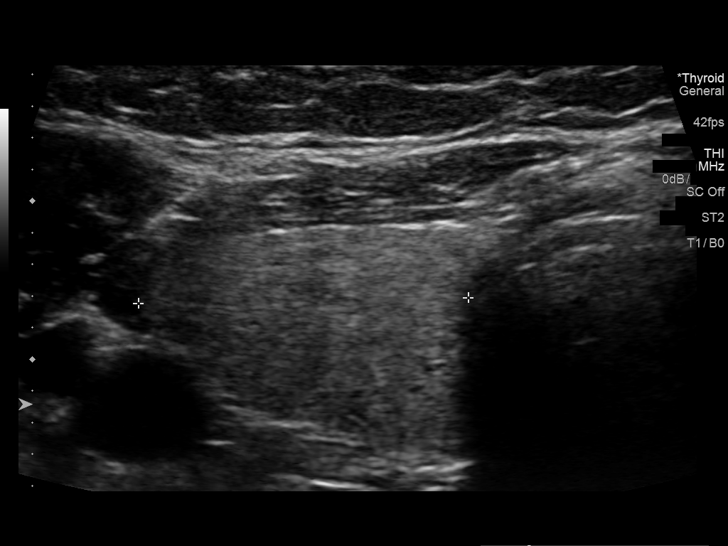
[im 8/45]
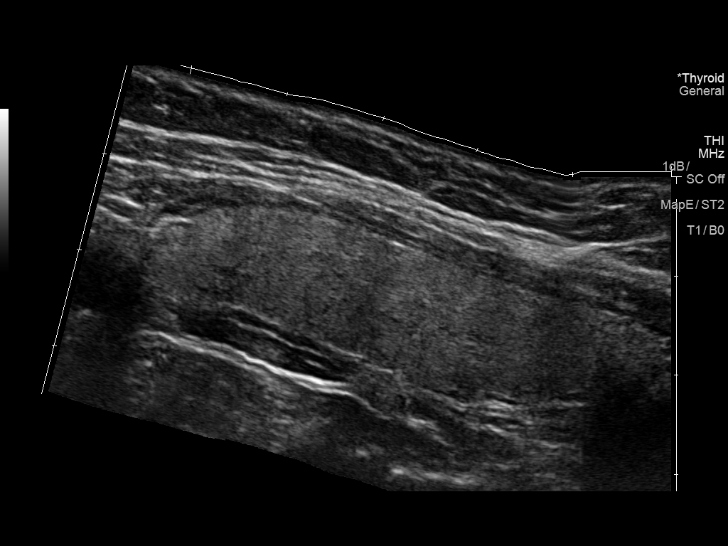
[im 12/45]
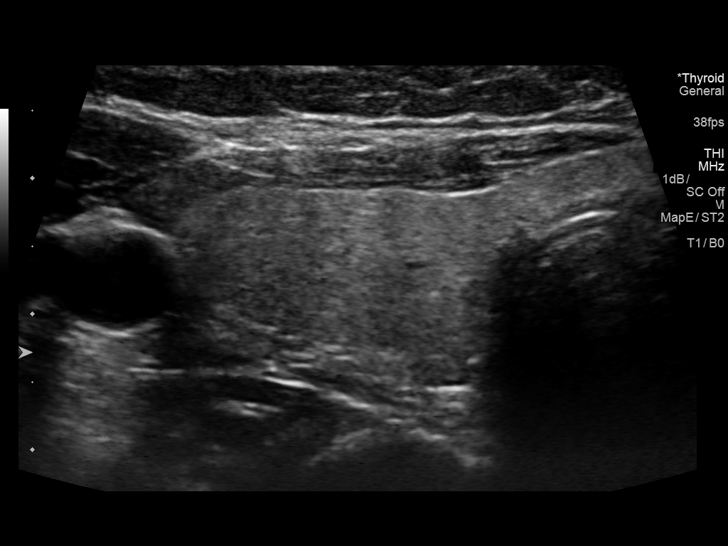
[im 15/45]
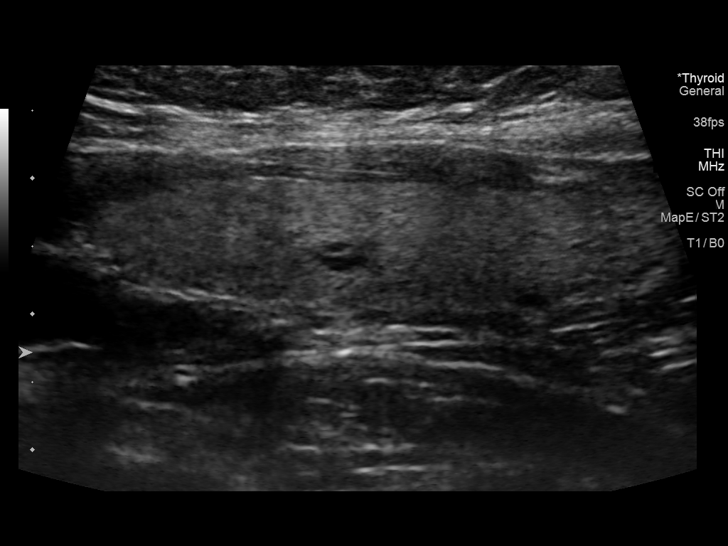
[im 17/45]
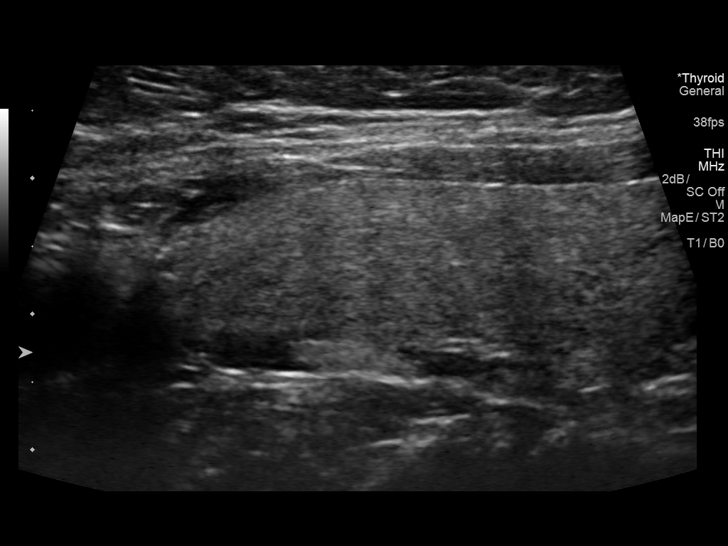
[im 21/45]
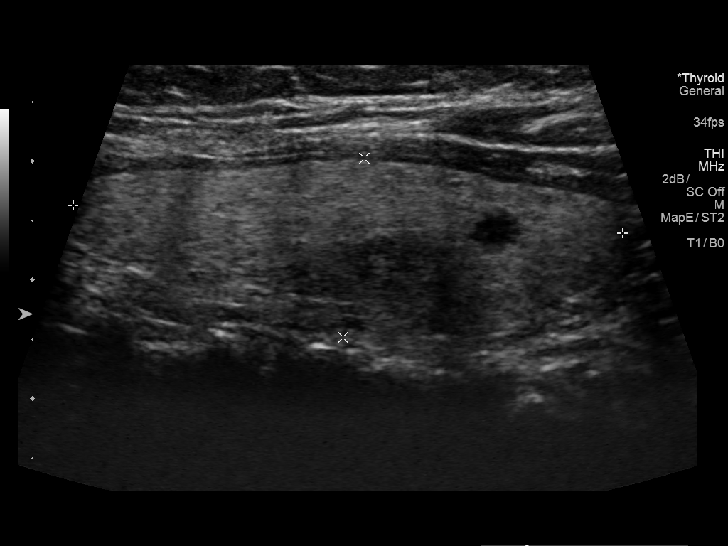
[im 24/45]
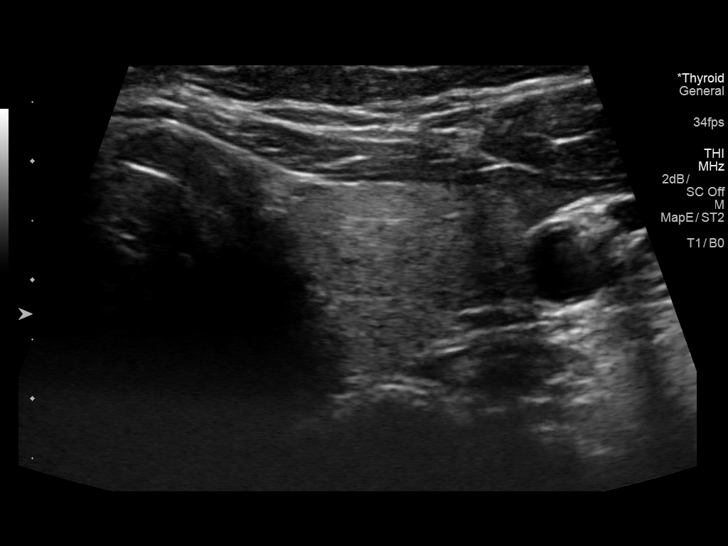
[im 28/45]
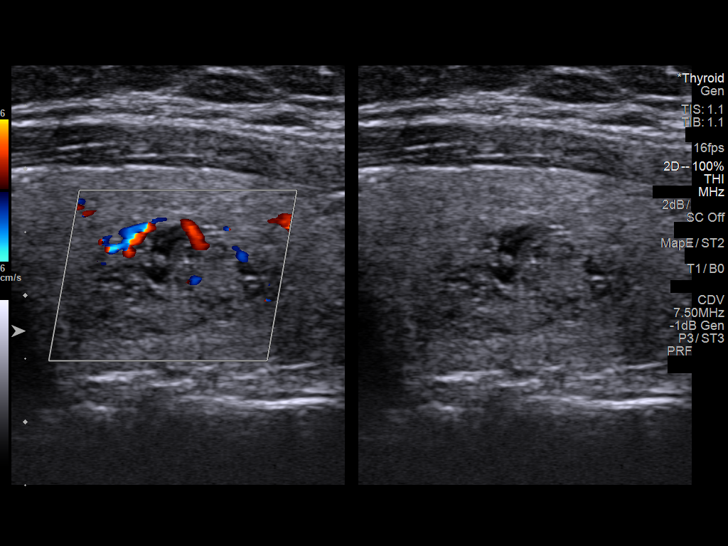
[im 30/45]
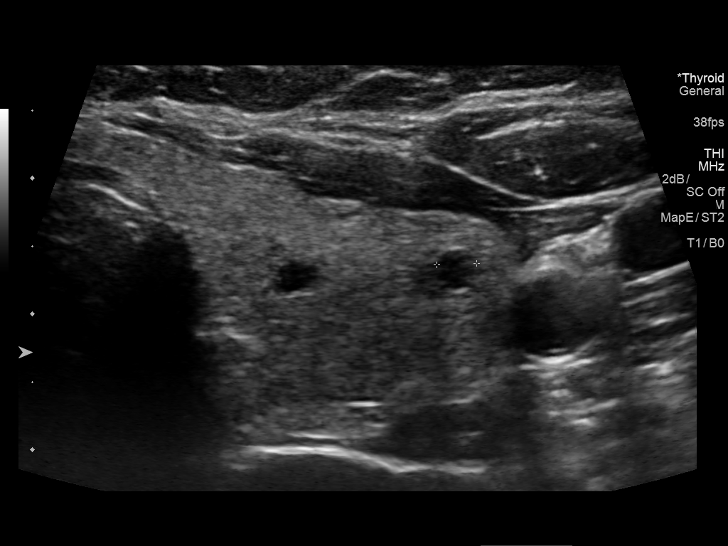
[im 34/45]
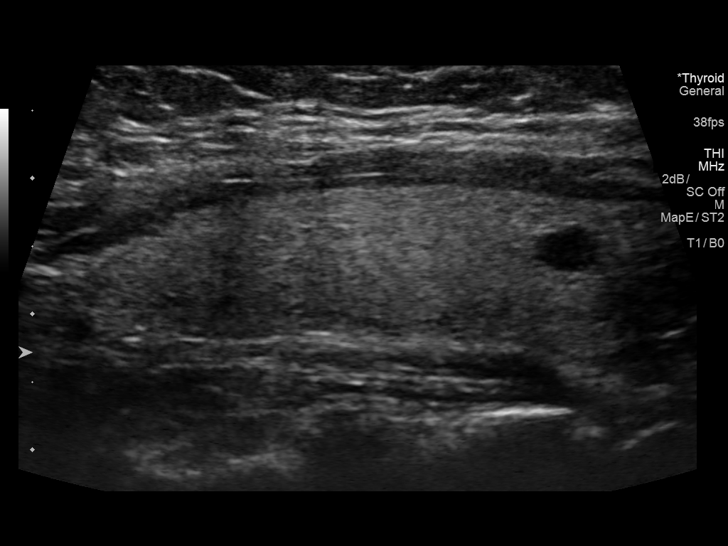
[im 37/45]
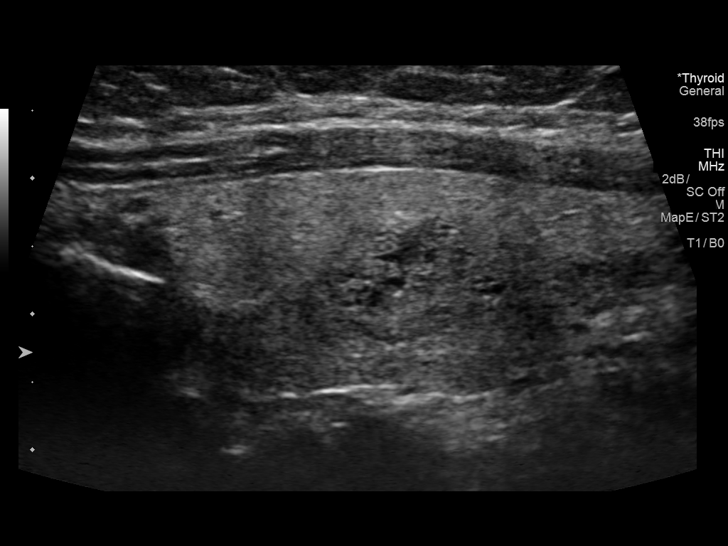
[im 41/45]
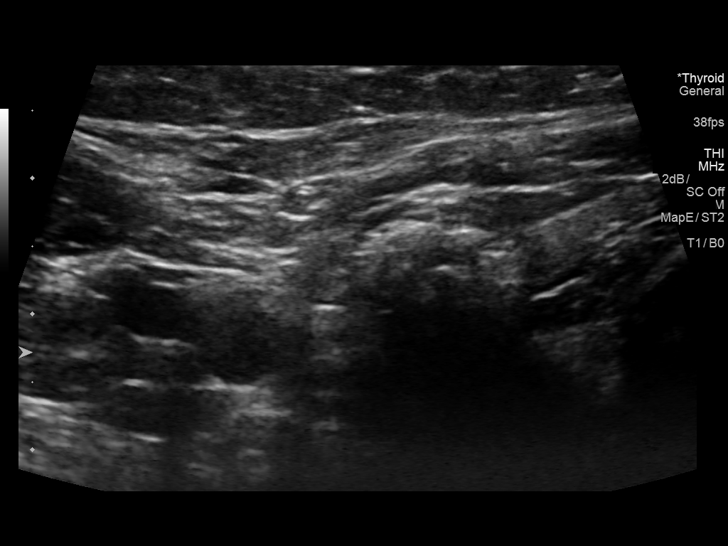
[im 45/45]
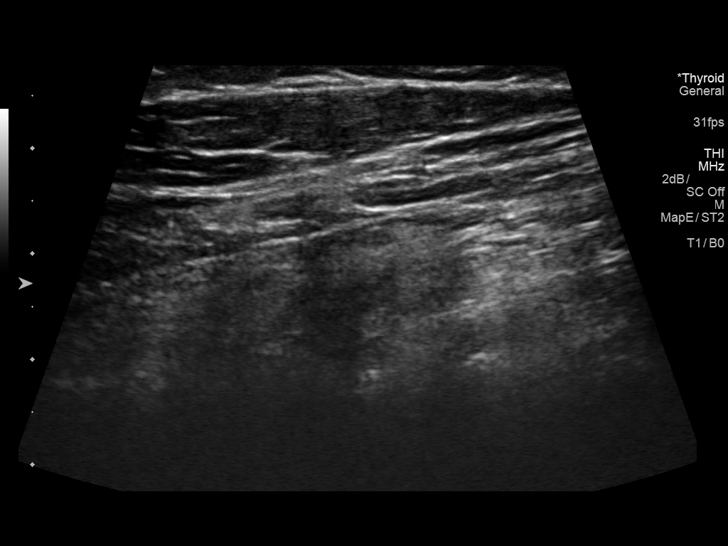

[14 of 25 positions shown; findings below may reference images not displayed]

FINDINGS: Parenchymal Echotexture: Mildly heterogenous

Isthmus: 0.4 cm thickness, previously

Right lobe: 5.4 x 1.3 x 2.1 cm, previously 5.8 x 1.5 x

Left lobe: 4.6 x 1.5 x 2.3 cm, previously 4.9 x 1.6 x

_________________________________________________________

Estimated total number of nodules >/= 1 cm: 1

Number of spongiform nodules >/=  2 cm not described below (TR1): 0

Number of mixed cystic and solid nodules >/= 1.5 cm not described
below (TR2): 0

_________________________________________________________

1.5 x 1.3 x 0.9 cm poorly marginated isoechoic mid left nodule,
previously 1.3 x 1.1 x 0.7; this was previously biopsied

Small scattered colloid cysts bilaterally less than 5 mm.
IMPRESSION: 1. Mild thyromegaly with stable left nodule, previously biopsied.
No radiologic indication for biopsy or dedicated imaging follow-up.

The above is in keeping with the ACR TI-RADS recommendations - [HOSPITAL] 6839;[DATE].

## 2019-12-11 IMAGING — MR MR CERVICAL SPINE WO/W CM
5 of 8 series · 26 of 48 positions shown · IV contrast (multihance)
Comparison: Previous MRIs from 01/29/2016 and 02/07/2016.

CLINICAL DATA: Follow-up examination for multiple sclerosis.

EXAM:
MRI HEAD WITHOUT AND WITH CONTRAST
MRI CERVICAL SPINE WITHOUT AND WITH CONTRAST
TECHNIQUE: Multiplanar, multiecho pulse sequences of the brain and surrounding
structures, and cervical spine, to include the craniocervical
junction and cervicothoracic junction, were obtained without and
with intravenous contrast.
CONTRAST:  20mL MULTIHANCE GADOBENATE DIMEGLUMINE 529 MG/ML IV SOLN

[Series 2: T1 · sagittal · 3.0mm · 0.82mm/px · 4 of 16 slices shown (1 of 3)]
[im 1/16]
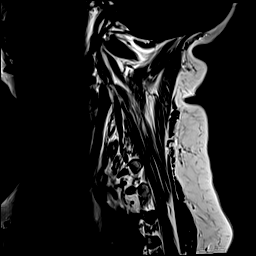
[im 6/16]
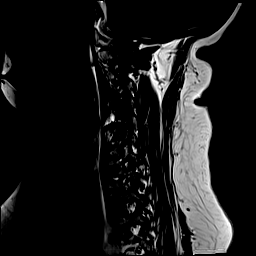
[im 11/16]
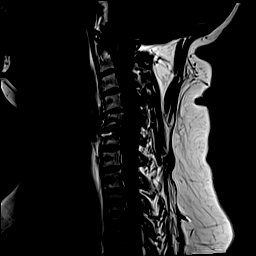
[im 16/16]
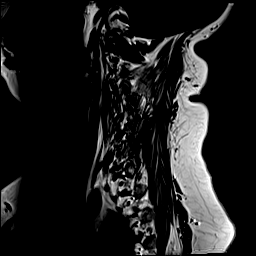

[Series 4: T2 · axial · 3.0mm · 0.50mm/px · z∈[-207,-115]mm · 8 of 30 slices shown (1 of 2)]
[im 1/30]
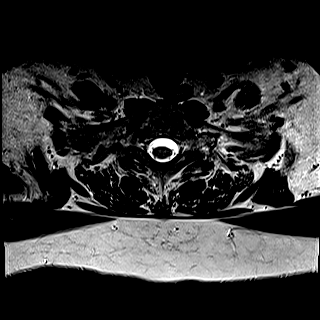
[im 5/30]
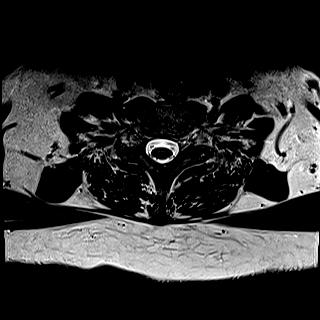
[im 9/30]
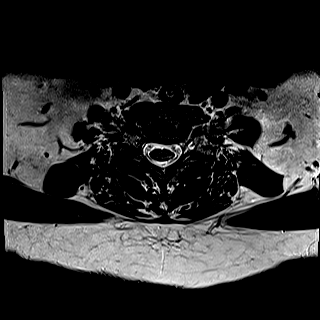
[im 13/30]
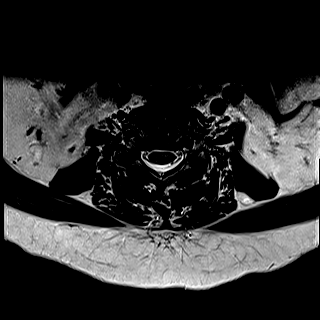
[im 17/30]
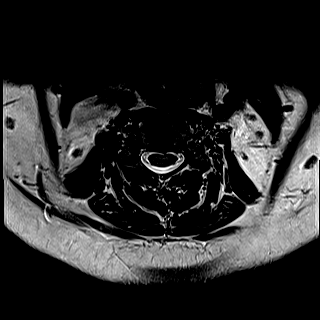
[im 21/30]
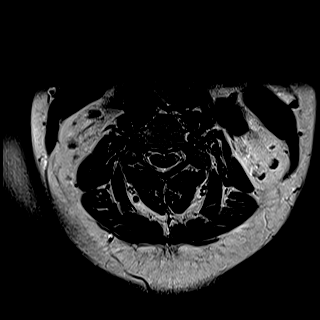
[im 25/30]
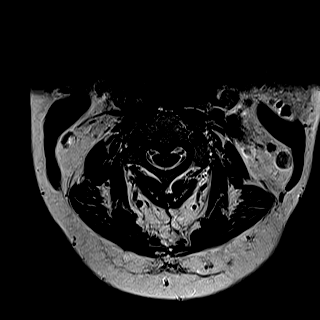
[im 30/30]
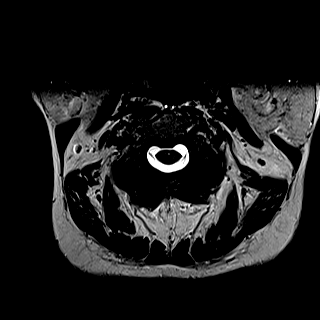

[Series 6: T1 · axial · non-contrast · 3.0mm · 0.31mm/px · z∈[-207,-115]mm · 8 of 30 slices shown (2 of 3)]
[im 1/30]
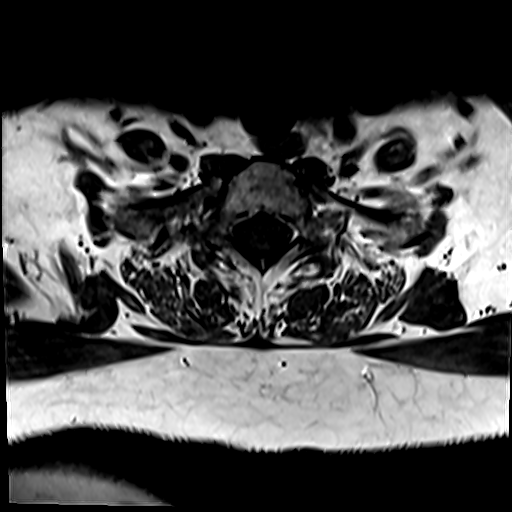
[im 5/30]
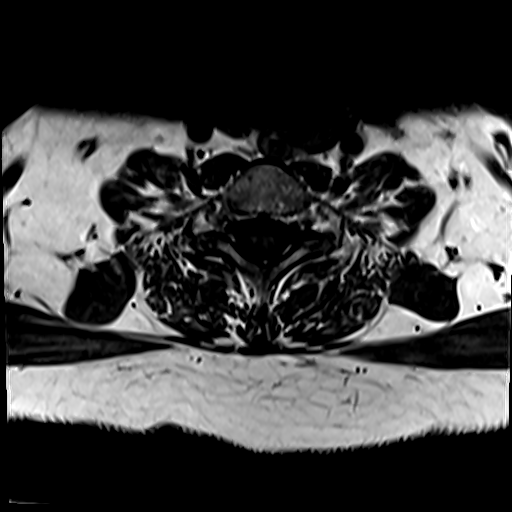
[im 9/30]
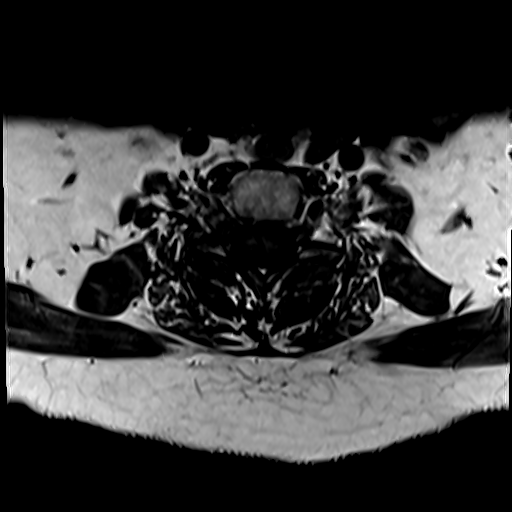
[im 13/30]
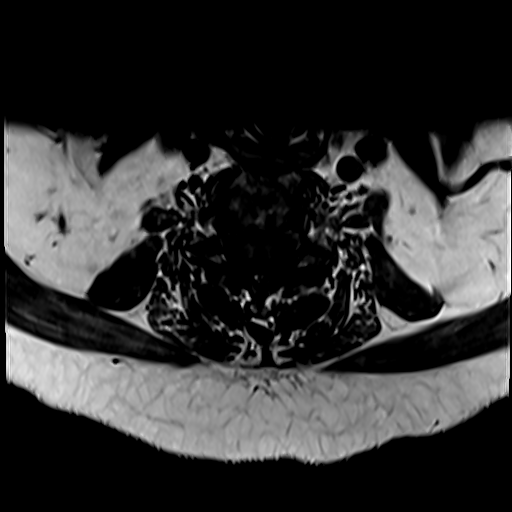
[im 17/30]
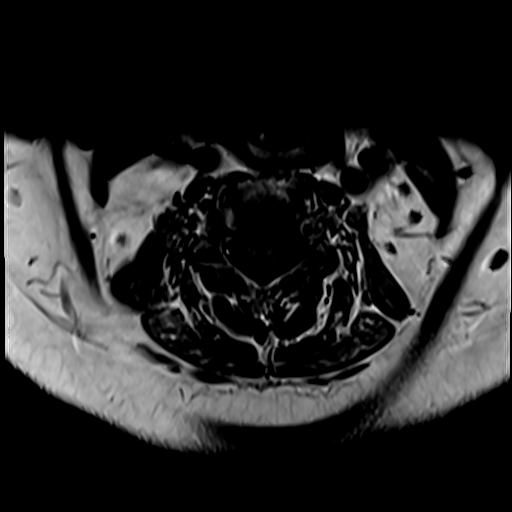
[im 21/30]
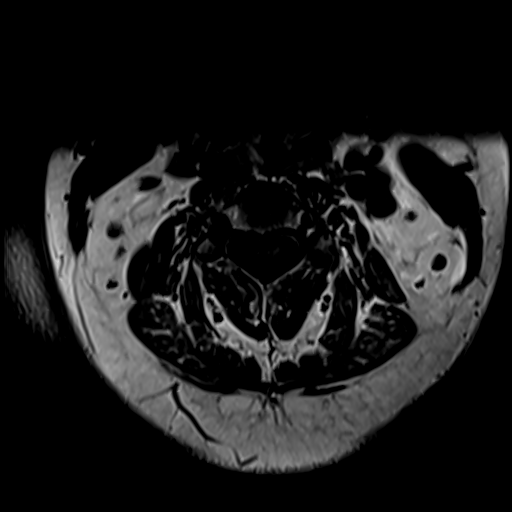
[im 25/30]
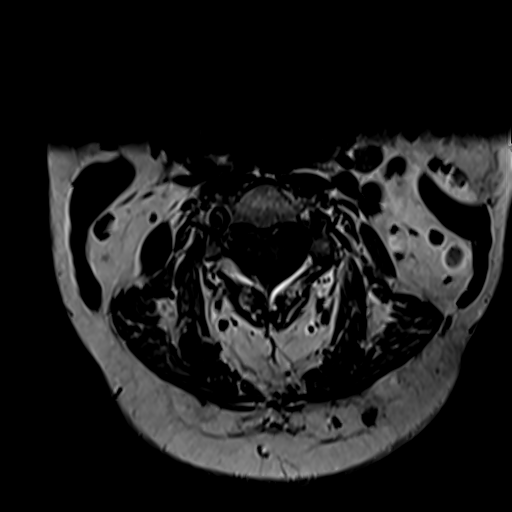
[im 30/30]
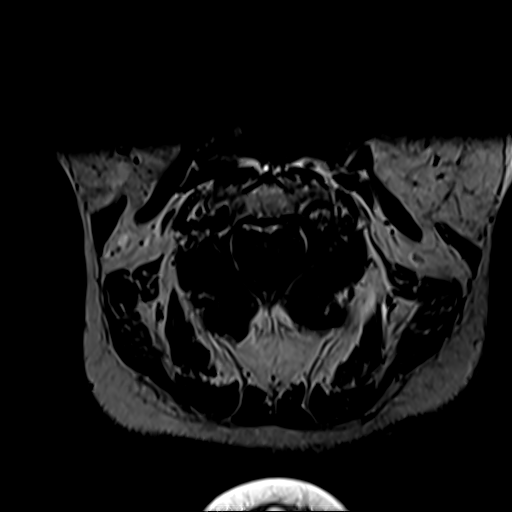

[Series 7: T2 · sagittal · 3.0mm · 0.82mm/px · 4 of 16 slices shown (2 of 2)]
[im 1/16]
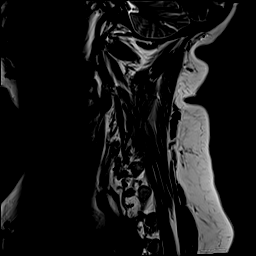
[im 6/16]
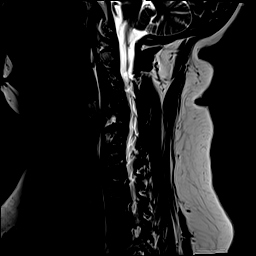
[im 11/16]
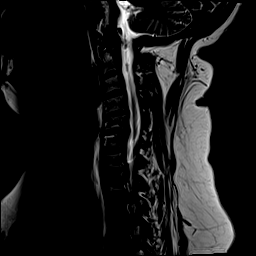
[im 16/16]
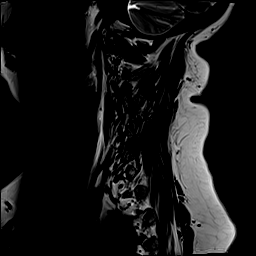

[Series 9: T1 · axial · 3.0mm · 0.31mm/px · z∈[-207,-194]mm · 2 of 30 slices shown (3 of 3)]
[im 1/30]
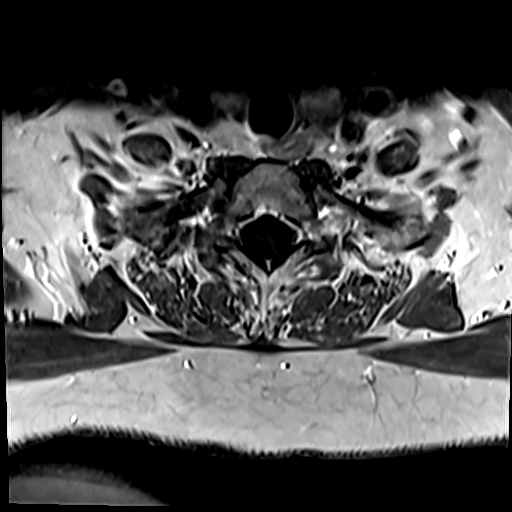
[im 5/30]
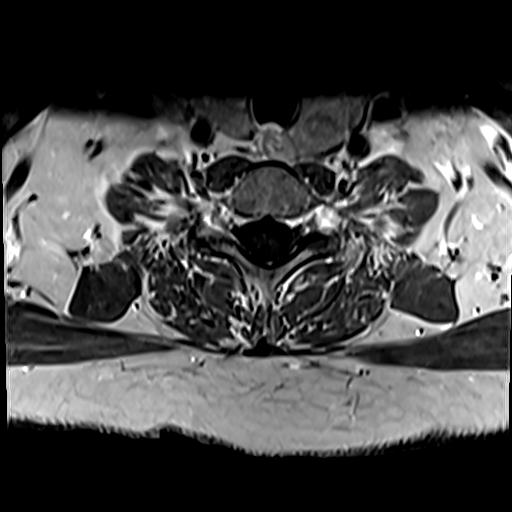

[26 of 48 positions shown; findings below may reference images not displayed]

FINDINGS: MRI HEAD FINDINGS

Brain: Cerebral volume stable, and remains within normal limits for
age. Scattered patchy and confluent T2/FLAIR hyperintensities seen
involving the periventricular, deep, and juxta cortical white matter
both cerebral hemispheres, nonspecific, but presumably related to
fatigue history of multiple sclerosis. Overall, appearance is mildly
progressed relative to 3491, with increased prominence of these
lesions as well as a few additional probable scattered new
subcentimeter lesions. No restricted diffusion or enhancement to
suggest active demyelination. Lesion involving the left
cervicomedullary junction again noted. Otherwise there is relative
sparing of the brainstem and cerebellum.

No evidence for acute infarct. Gray-white matter differentiation
maintained without evidence for chronic cortical infarction. No
evidence for acute or chronic intracranial hemorrhage. No mass
lesion, midline shift or mass effect. No hydrocephalus. No
extra-axial fluid collection. Pituitary gland normal. Midline
structures intact. No other abnormal enhancement.

Vascular: Major intracranial vascular flow voids are maintained.

Skull and upper cervical spine: Craniocervical junction within
normal limits. Bone marrow signal intensity normal. No scalp soft
tissue abnormality.

Sinuses/Orbits: Globes orbital soft tissues demonstrate no acute
finding. Paranasal sinuses are clear. No significant mastoid
effusion.

Other: None.

MRI CERVICAL SPINE FINDINGS

Alignment: Straightening with slight reversal of the normal cervical
lordosis. No listhesis.

Vertebrae: Vertebral body height maintained without evidence for
acute or chronic fracture. Bone marrow signal intensity within
normal limits. No discrete or worrisome osseous lesions. Prominent
reactive endplate changes with associated marrow edema and
enhancement seen about the C5-6 interspace. No other abnormal edema.

Cord: Focal signal abnormality within the left dorsal cord at the
level of C1-2 again seen (series 3, image 10), similar. Additional
faint patchy signal abnormality seen within the left hemi cord at
the level of C4 (series 4, image 10), also stable. Additional mild
signal abnormality seen within the left dorsal lateral cord at the
level of C6-7 (series 4, image 22), also unchanged. No new foci
identified. No abnormal enhancement to suggest active demyelination.

Posterior Fossa, vertebral arteries, paraspinal tissues: Paraspinous
and prevertebral soft tissues demonstrate no acute finding. Normal
intravascular flow voids seen within the vertebral arteries
bilaterally.

Disc levels:

C2-C3: Migrated disc material from the C3-4 level inferiorly,
indenting the ventral thecal sac. No significant stenosis or cord
deformity. Otherwise negative.

C3-C4: Moderate sized central disc extrusion with superior
migration, increased in size from previous. Disc material contacts
and mildly flattens the ventral spinal cord and resultant mild
spinal stenosis. Foramina remain widely patent.

C4-C5: Chronic intervertebral disc space narrowing with diffuse
degenerative disc osteophyte. Left greater than right uncinate
spurring. Flattening of the ventral thecal sac with resultant mild
spinal stenosis. Moderate left with mild right C5 foraminal
stenosis. Appearance is mildly progressed.

C5-C6: Chronic intervertebral disc space narrowing with diffuse
degenerative disc osteophyte. Flattening of the ventral thecal sac
without significant spinal stenosis. Moderate right with mild left
C6 foraminal narrowing, mildly progressed.

C6-C7: Mild disc bulge. No significant canal or foraminal stenosis.

C7-T1:  Unremarkable.

Visualized upper thoracic spine demonstrates no significant finding.
IMPRESSION: MRI HEAD IMPRESSION:

1. Patchy signal abnormality involving the supratentorial cerebral
white matter, nonspecific, but presumably related to provided
history of multiple sclerosis. Overall, appearance is mildly
progressed relative to 3491. No evidence for active demyelination on
today's exam.
2. Otherwise stable and normal brain MRI. No other acute
intracranial abnormality.

MRI CERVICAL SPINE IMPRESSION:

1. Multifocal cord signal abnormality with focal lesions at C1-2,
C4, and C6-7 as above. Overall, appearance is relatively unchanged
from previous without evidence for significant progression. No
evidence for active demyelination.
2. Moderate-sized central disc extrusion with superior migration at
C3-4 with resultant mild spinal stenosis.
3. Left eccentric disc osteophyte at C4-5 with resultant mild canal
with moderate left C5 foraminal narrowing.
4. Diffuse disc osteophyte at C5-6 with resultant moderate right and
mild left C6 foraminal narrowing.
